# Patient Record
Sex: Male | Born: 1975
Health system: Southern US, Community
[De-identification: ages and names within clinical notes are randomized; demographics above are authoritative.]

## PROBLEM LIST (undated history)

## (undated) DIAGNOSIS — Z8042 Family history of malignant neoplasm of prostate: Secondary | ICD-10-CM

## (undated) DIAGNOSIS — I10 Essential (primary) hypertension: Secondary | ICD-10-CM

## (undated) DIAGNOSIS — Z800 Family history of malignant neoplasm of digestive organs: Secondary | ICD-10-CM

## (undated) DIAGNOSIS — K635 Polyp of colon: Secondary | ICD-10-CM

## (undated) DIAGNOSIS — J309 Allergic rhinitis, unspecified: Secondary | ICD-10-CM

## (undated) DIAGNOSIS — E669 Obesity, unspecified: Secondary | ICD-10-CM

## (undated) DIAGNOSIS — N529 Male erectile dysfunction, unspecified: Secondary | ICD-10-CM

## (undated) DIAGNOSIS — N419 Inflammatory disease of prostate, unspecified: Secondary | ICD-10-CM

## (undated) HISTORY — DX: Essential (primary) hypertension: I10

## (undated) HISTORY — DX: Male erectile dysfunction, unspecified: N52.9

## (undated) HISTORY — DX: Allergic rhinitis, unspecified: J30.9

## (undated) HISTORY — DX: Inflammatory disease of prostate, unspecified: N41.9

## (undated) HISTORY — PX: WISDOM TOOTH EXTRACTION: SHX21

## (undated) HISTORY — DX: Family history of malignant neoplasm of digestive organs: Z80.0

## (undated) HISTORY — PX: APPENDECTOMY: SHX54

## (undated) HISTORY — DX: Obesity, unspecified: E66.9

## (undated) HISTORY — DX: Polyp of colon: K63.5

## (undated) HISTORY — DX: Family history of malignant neoplasm of prostate: Z80.42

---

## 2003-04-23 ENCOUNTER — Emergency Department (HOSPITAL_COMMUNITY): Admission: EM | Admit: 2003-04-23 | Discharge: 2003-04-23 | Payer: Self-pay | Admitting: Emergency Medicine

## 2003-08-14 ENCOUNTER — Emergency Department (HOSPITAL_COMMUNITY): Admission: EM | Admit: 2003-08-14 | Discharge: 2003-08-14 | Payer: Self-pay | Admitting: Emergency Medicine

## 2005-11-17 ENCOUNTER — Emergency Department (HOSPITAL_COMMUNITY): Admission: EM | Admit: 2005-11-17 | Discharge: 2005-11-17 | Payer: Self-pay | Admitting: Family Medicine

## 2005-12-31 ENCOUNTER — Emergency Department (HOSPITAL_COMMUNITY): Admission: EM | Admit: 2005-12-31 | Discharge: 2005-12-31 | Payer: Self-pay | Admitting: Family Medicine

## 2006-02-11 ENCOUNTER — Emergency Department (HOSPITAL_COMMUNITY): Admission: EM | Admit: 2006-02-11 | Discharge: 2006-02-11 | Payer: Self-pay | Admitting: Family Medicine

## 2008-01-07 ENCOUNTER — Emergency Department (HOSPITAL_COMMUNITY): Admission: EM | Admit: 2008-01-07 | Discharge: 2008-01-07 | Payer: Self-pay | Admitting: Family Medicine

## 2008-05-09 ENCOUNTER — Emergency Department (HOSPITAL_COMMUNITY): Admission: EM | Admit: 2008-05-09 | Discharge: 2008-05-10 | Payer: Self-pay | Admitting: Emergency Medicine

## 2009-10-27 ENCOUNTER — Emergency Department (HOSPITAL_COMMUNITY): Admission: EM | Admit: 2009-10-27 | Discharge: 2009-10-27 | Payer: Self-pay | Admitting: Emergency Medicine

## 2010-04-03 IMAGING — CR DG CHEST 2V
2 series · 2 of 2 positions shown · non-contrast
Comparison: None

CLINICAL DATA: Hemoptysis

CHEST - 2 VIEW

[w chest pa]
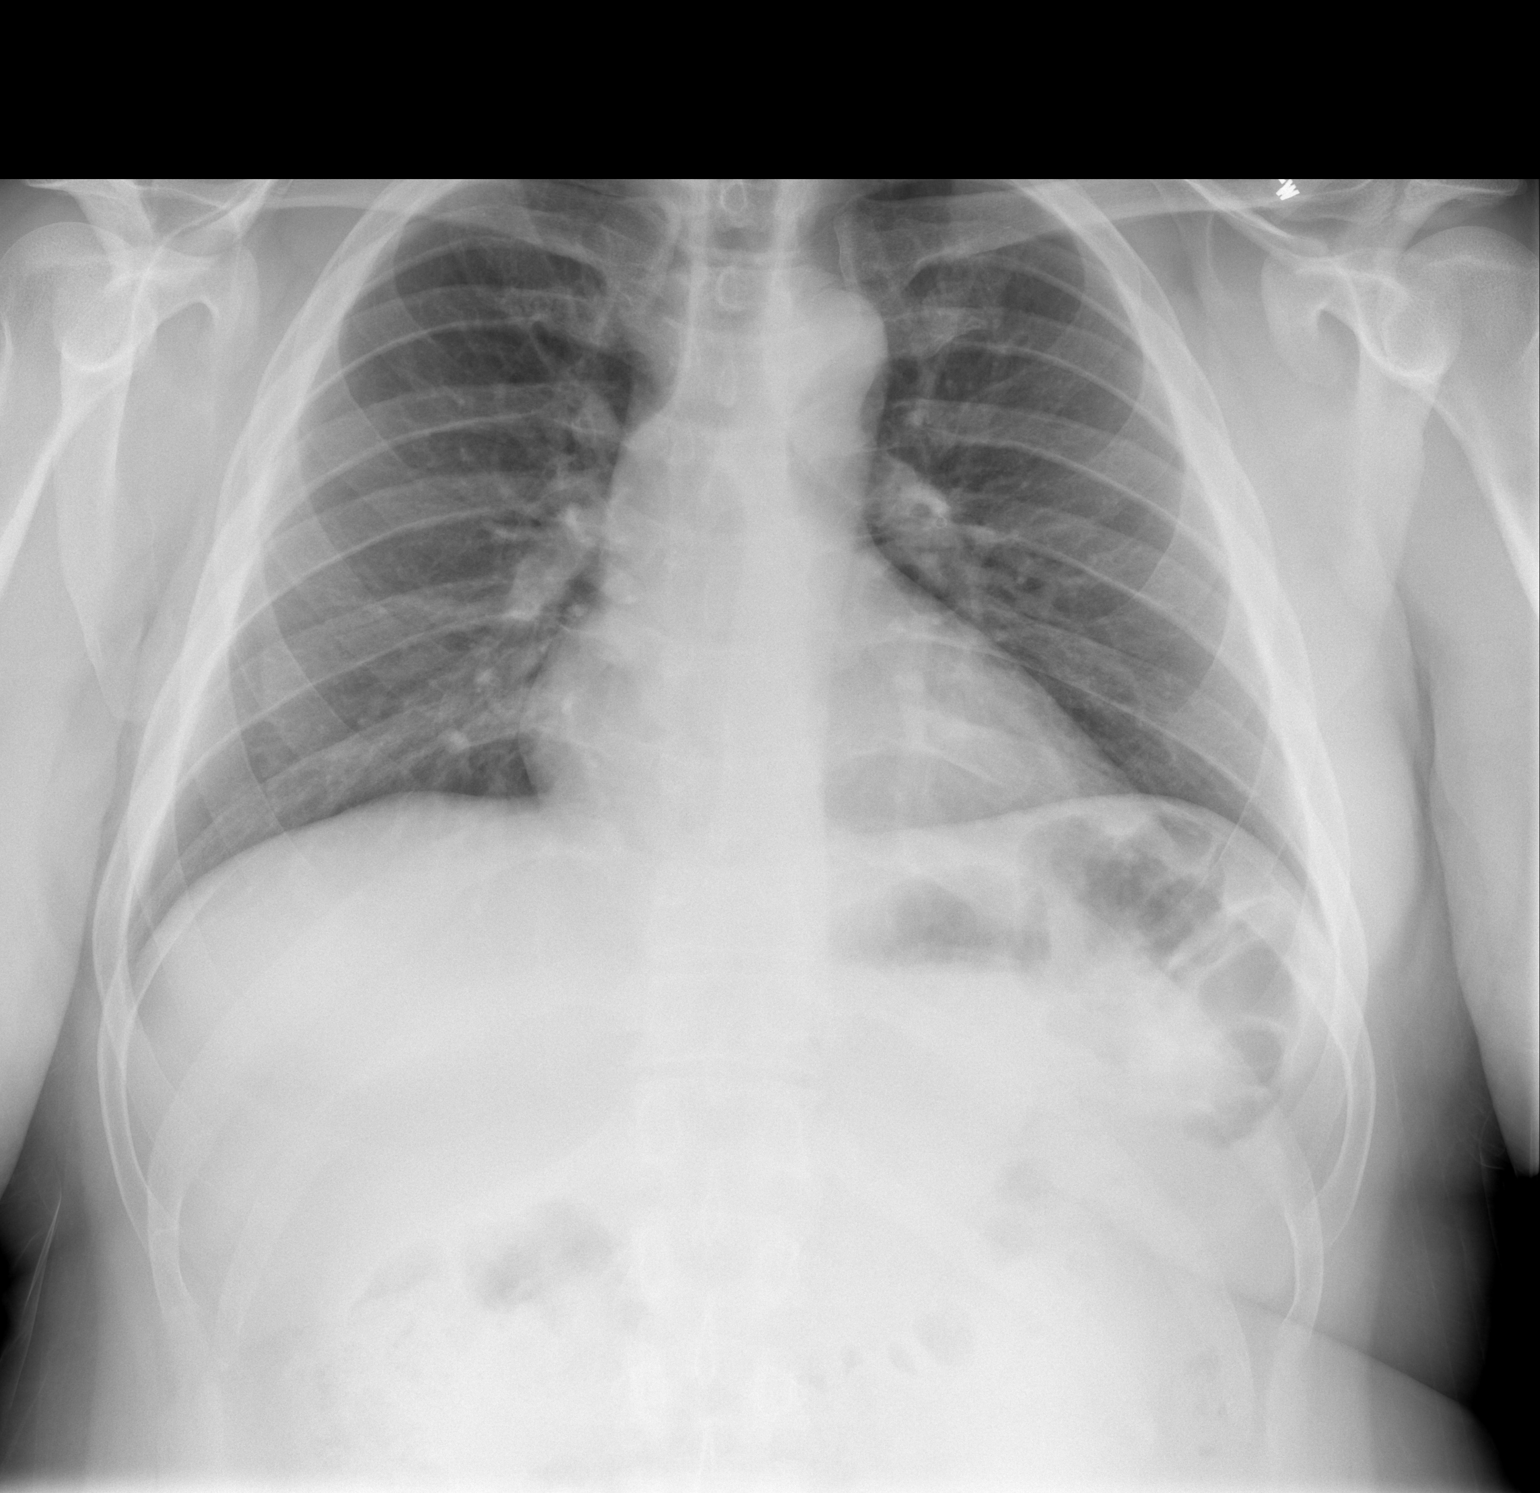

[w chest lat]
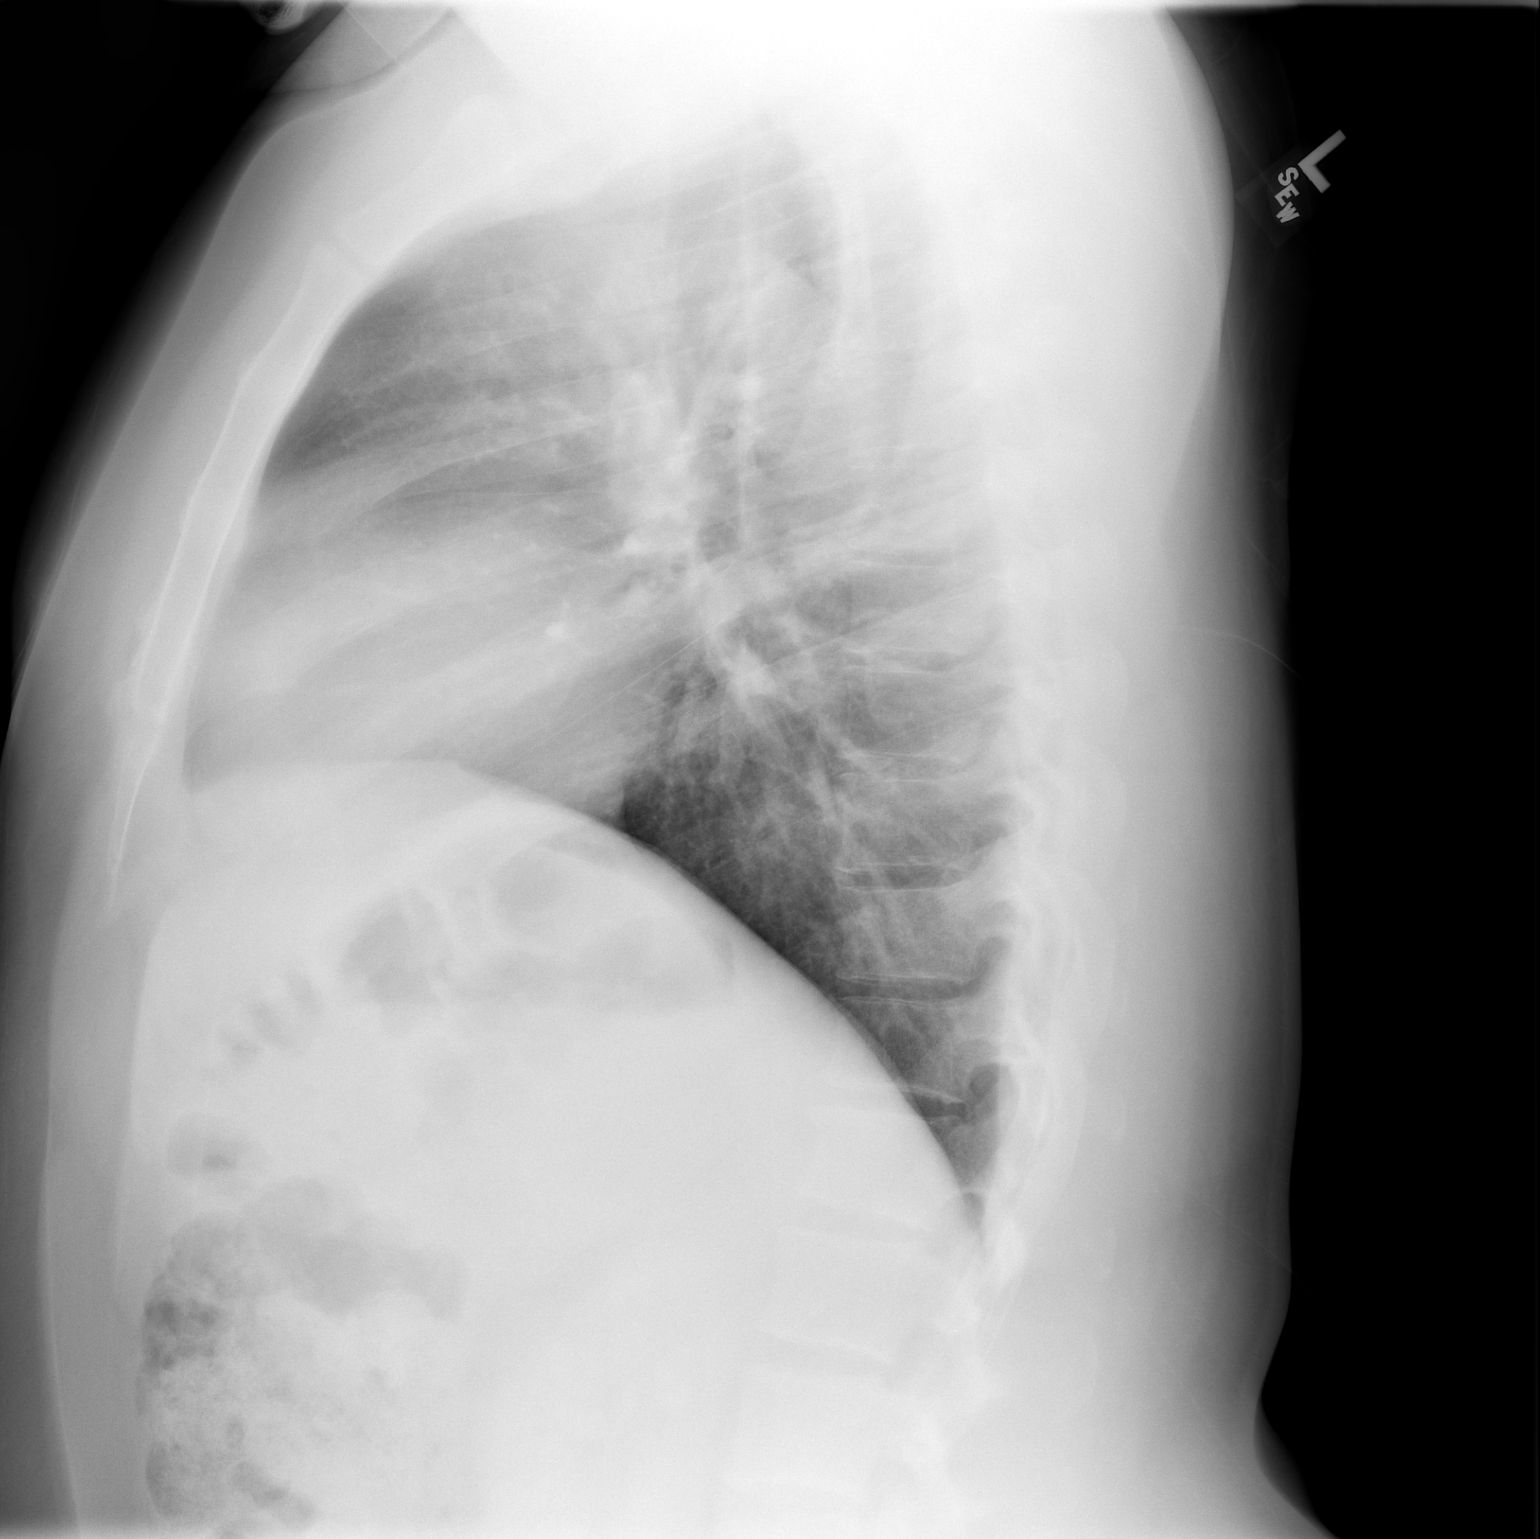

[2 of 2 positions shown; findings below may reference images not displayed]

FINDINGS: The heart size and mediastinal contours are within normal
limits.  Both lungs are clear.  The visualized skeletal structures
are unremarkable.
IMPRESSION: No active cardiopulmonary disease.

## 2011-02-12 LAB — CULTURE, ROUTINE-ABSCESS

## 2013-04-10 ENCOUNTER — Ambulatory Visit (INDEPENDENT_AMBULATORY_CARE_PROVIDER_SITE_OTHER): Payer: BC Managed Care – PPO | Admitting: Medical

## 2013-04-10 ENCOUNTER — Telehealth: Payer: Self-pay | Admitting: Family Medicine

## 2013-04-10 ENCOUNTER — Encounter: Payer: Self-pay | Admitting: Internal Medicine

## 2013-04-10 ENCOUNTER — Encounter: Payer: Self-pay | Admitting: Medical

## 2013-04-10 VITALS — BP 100/78 | HR 60 | Temp 98.2°F | Resp 16 | Ht 67.0 in | Wt 254.0 lb

## 2013-04-10 DIAGNOSIS — D2370 Other benign neoplasm of skin of unspecified lower limb, including hip: Secondary | ICD-10-CM

## 2013-04-10 DIAGNOSIS — E669 Obesity, unspecified: Secondary | ICD-10-CM

## 2013-04-10 DIAGNOSIS — Z23 Encounter for immunization: Secondary | ICD-10-CM

## 2013-04-10 DIAGNOSIS — R229 Localized swelling, mass and lump, unspecified: Secondary | ICD-10-CM

## 2013-04-10 DIAGNOSIS — Z8042 Family history of malignant neoplasm of prostate: Secondary | ICD-10-CM

## 2013-04-10 DIAGNOSIS — Z800 Family history of malignant neoplasm of digestive organs: Secondary | ICD-10-CM

## 2013-04-10 DIAGNOSIS — D367 Benign neoplasm of other specified sites: Secondary | ICD-10-CM

## 2013-04-10 DIAGNOSIS — Z0000 Encounter for general adult medical examination without abnormal findings: Secondary | ICD-10-CM

## 2013-04-10 LAB — CBC WITH DIFFERENTIAL/PLATELET
Basophils Absolute: 0 10*3/uL (ref 0.0–0.1)
Basophils Relative: 0 % (ref 0–1)
Eosinophils Absolute: 0.1 10*3/uL (ref 0.0–0.7)
MCH: 30.6 pg (ref 26.0–34.0)
MCHC: 34.4 g/dL (ref 30.0–36.0)
Neutro Abs: 2.9 10*3/uL (ref 1.7–7.7)
Neutrophils Relative %: 52 % (ref 43–77)
Platelets: 259 10*3/uL (ref 150–400)
RDW: 13.7 % (ref 11.5–15.5)

## 2013-04-10 LAB — POCT URINALYSIS DIPSTICK
Bilirubin, UA: NEGATIVE
Blood, UA: NEGATIVE
Glucose, UA: NEGATIVE
Nitrite, UA: NEGATIVE
Spec Grav, UA: <=1.005
Urobilinogen, UA: NEGATIVE

## 2013-04-10 NOTE — Progress Notes (Signed)
Subjective:   HPI  Steven Mullins is a 37 y.o. male who presents for a complete physical.  New patient today.   Preventative care: Last ophthalmology visit:n/a Last dental visit:yes- Dr. Lowell Guitar Last colonoscopy:n/a Last prostate exam: n/a Last EKG:2004/2005 Last labs:n/a, last physical 5 years ago  Prior vaccinations: TD or Tdap:04/10/13 Influenza:never Pneumococcal:n/a Shingles/Zostavax:n/a  Advanced directive:n/a Health care power of attorney:n/a Living will:n/a  Concerns: Knot of right anterior thigh for 3 years unchanged.  Reviewed their medical, surgical, family, social, medication, and allergy history and updated chart as appropriate.   Past Medical History  Diagnosis Date  . Allergic rhinitis     Past Surgical History  Procedure Laterality Date  . Appendectomy    . Wisdom tooth extraction      Family History  Problem Relation Age of Onset  . Cancer Mother 49    colon  . Hypertension Mother   . Thyroid disease Mother   . Hypertension Father   . Cancer Father 61    prostate  . Thyroid disease Brother   . Cancer Maternal Uncle     prostate  . Heart disease Maternal Uncle   . Cancer Maternal Grandfather     prostate  . Diabetes Neg Hx   . Stroke Neg Hx     History   Social History  . Marital Status: Married    Spouse Name: N/A    Number of Children: N/A  . Years of Education: N/A   Occupational History  . Not on file.   Social History Main Topics  . Smoking status: Never Smoker   . Smokeless tobacco: Not on file  . Alcohol Use: No  . Drug Use: No  . Sexually Active: Not on file   Other Topics Concern  . Not on file   Social History Narrative   Married, 2 children, limited exercise, works in Clinical biochemist at [employer]    No current outpatient prescriptions on file prior to visit.   No current facility-administered medications on file prior to visit.    No Known Allergies   Review of Systems Constitutional: -fever,  -chills, -sweats, -unexpected weight change, -decreased appetite, -fatigue Allergy: -sneezing, -itching, -congestion Dermatology: -changing moles, --rash, +lumps ENT: -runny nose, -ear pain, -sore throat, -hoarseness, -sinus pain, -teeth pain, - ringing in ears, -hearing loss, -nosebleeds Cardiology: -chest pain, -palpitations, -swelling, -difficulty breathing when lying flat, -waking up short of breath Respiratory: -cough, -shortness of breath, -difficulty breathing with exercise or exertion, -wheezing, -coughing up blood Gastroenterology: -abdominal pain, -nausea, -vomiting, -diarrhea, -constipation, -blood in stool, -changes in bowel movement, -difficulty swallowing or eating Hematology: -bleeding, -bruising  Musculoskeletal: -joint aches, -muscle aches, -joint swelling, -back pain, -neck pain, -cramping, -changes in gait Ophthalmology: denies vision changes, eye redness, itching, discharge Urology: -burning with urination, -difficulty urinating, -blood in urine, -urinary frequency, -urgency, -incontinence Neurology: -headache, -weakness, -tingling, -numbness, -memory loss, -falls, -dizziness Psychology: -depressed mood, -agitation, -sleep problems     Objective:   Physical Exam  Nurse notes and vitals reviewed   General appearance: alert, no distress, WD/WN, obese AA male Skin: right earlobe with 2 thickened nodular areas posteriorly and superior edge of pinna with thickened skin he attributes to callous from chronic picking of the ears, similar superior pinna thickening left ear too.  Small 3-1mm diameter dermoid cyst of right upper thigh anteriorly, othewise skin unremarkable HEENT: normocephalic, conjunctiva/corneas normal, sclerae anicteric, PERRLA, EOMi, nares patent, no discharge or erythema, pharynx normal Oral cavity: MMM, tongue normal, teeth in  good repair Neck: supple, no lymphadenopathy, no thyromegaly, no masses, normal ROM Chest: non tender, normal shape and  expansion Heart: RRR, normal S1, S2, no murmurs Lungs: CTA bilaterally, no wheezes, rhonchi, or rales Abdomen: +bs, umbilical, RUQ and RLQ surgical scars, soft, non tender, non distended, no masses, no hepatomegaly, no splenomegaly, no bruits Back: non tender, normal ROM, no scoliosis Musculoskeletal: upper extremities non tender, no obvious deformity, normal ROM throughout, lower extremities non tender, no obvious deformity, normal ROM throughout Extremities: no edema, no cyanosis, no clubbing Pulses: 2+ symmetric, upper and lower extremities, normal cap refill Neurological: alert, oriented x 3, CN2-12 intact, strength normal upper extremities and lower extremities, sensation normal throughout, DTRs 2+ throughout, no cerebellar signs, gait normal Psychiatric: normal affect, behavior normal, pleasant  GU: normal male external genitalia, nontender, few small spermatoceles palpated, but otherwise no masses, no hernia, no lymphadenopathy Rectal: anus normal tone, prostate WNL, no nodules, occult negative stool   Assessment and Plan :      Encounter Diagnoses  Name Primary?  . Routine general medical examination at a health care facility Yes  . Need for Tdap vaccination   . Family history of colon cancer   . Family history of prostate cancer   . Obesity, unspecified   . Dermoid cyst of leg, right   . Skin nodule     Physical exam - discussed healthy lifestyle, diet, exercise, preventative care, vaccinations, and addressed their concerns.    tdap vaccine, VIS and counseling given  Referral for screening colonoscopy given mother's early history.  Prostate exam today.  Discussed testicular cancer screening, regular self exams.  Obesity - advised lifestyle changes, weight loss  Cyst of leg - small, reassured  Skin nodules - bilat ears, thickened, he attributes to callous from habitual twisting rubbing of his ears.   Will keep and eye on this due to sun exposure, potential for skin  cancer higher in this sun exposed area.    Follow-up pending labs

## 2013-04-10 NOTE — Telephone Encounter (Signed)
Patient is aware of his appointment for a colonoscopy on April 28, 2013 @ 1000 am with Dr. Leone Payor and nurse visit on April 16, 2013 @ 430 pm. CLS [phone]

## 2013-04-11 LAB — LIPID PANEL
Cholesterol: 181 mg/dL (ref 0–200)
Triglycerides: 65 mg/dL (ref <150)
VLDL: 13 mg/dL (ref 0–40)

## 2013-04-11 LAB — COMPREHENSIVE METABOLIC PANEL
AST: 21 U/L (ref 0–37)
Albumin: 4.3 g/dL (ref 3.5–5.2)
Alkaline Phosphatase: 88 U/L (ref 39–117)
BUN: 11 mg/dL (ref 6–23)
Calcium: 9.6 mg/dL (ref 8.4–10.5)
Chloride: 102 mEq/L (ref 96–112)
Potassium: 5 mEq/L (ref 3.5–5.3)
Sodium: 137 mEq/L (ref 135–145)
Total Protein: 6.9 g/dL (ref 6.0–8.3)

## 2013-04-28 ENCOUNTER — Encounter: Payer: Self-pay | Admitting: Internal Medicine

## 2013-06-18 ENCOUNTER — Telehealth: Payer: Self-pay

## 2013-06-18 NOTE — Telephone Encounter (Signed)
Called to reschedule previsit.  Left message.

## 2013-07-02 ENCOUNTER — Encounter: Payer: Self-pay | Admitting: Internal Medicine

## 2016-03-12 DIAGNOSIS — N419 Inflammatory disease of prostate, unspecified: Secondary | ICD-10-CM

## 2016-03-12 HISTORY — DX: Inflammatory disease of prostate, unspecified: N41.9

## 2016-03-22 ENCOUNTER — Ambulatory Visit (INDEPENDENT_AMBULATORY_CARE_PROVIDER_SITE_OTHER): Payer: 59 | Admitting: Medical

## 2016-03-22 ENCOUNTER — Encounter: Payer: Self-pay | Admitting: Medical

## 2016-03-22 VITALS — BP 130/90 | HR 77 | Wt 270.0 lb

## 2016-03-22 DIAGNOSIS — R300 Dysuria: Secondary | ICD-10-CM | POA: Diagnosis not present

## 2016-03-22 DIAGNOSIS — R39198 Other difficulties with micturition: Secondary | ICD-10-CM | POA: Diagnosis not present

## 2016-03-22 DIAGNOSIS — I861 Scrotal varices: Secondary | ICD-10-CM

## 2016-03-22 DIAGNOSIS — Z8042 Family history of malignant neoplasm of prostate: Secondary | ICD-10-CM

## 2016-03-22 DIAGNOSIS — R319 Hematuria, unspecified: Secondary | ICD-10-CM

## 2016-03-22 DIAGNOSIS — Z800 Family history of malignant neoplasm of digestive organs: Secondary | ICD-10-CM

## 2016-03-22 LAB — POCT URINALYSIS DIPSTICK
Bilirubin, UA: NEGATIVE
Blood, UA: NEGATIVE
Glucose, UA: NEGATIVE
KETONES UA: NEGATIVE
LEUKOCYTES UA: NEGATIVE
Nitrite, UA: NEGATIVE
PH UA: 6
PROTEIN UA: NEGATIVE
SPEC GRAV UA: 1.025
UROBILINOGEN UA: NEGATIVE

## 2016-03-22 NOTE — Progress Notes (Signed)
Subjective: Chief Complaint  Patient presents with  . Urinary Tract Infection    said he felt like he had a blockage when he tried to urinate, then it would burn. said that he feels like it is less now. said he did have some blood in the urine once last week.   Here for urinary c/o.  Last visit here 2014.  Thinks he has a UTI.  He notes recently felt like something was blocking the stream of urine . Had some stinging.   This continued for a few days.  Last week saw a tinge of blood in the urine.   Is able to void, but with these symptoms.   This started a month ago.   But in the last few days seems improved.  No fever, no NVD, urine doesn't look funnny or cloudy, doesn't have an odor.  No testicle pain, no testicle swelling, no penile discharge, no rectal pain.  In general gets up at least once nightly to urinate.   Sometimes has urinary hesitancy, but no dribbling.   Urine stream seems weaker than in the past, mainly in the morning,but fine stream in the day.   Married.  No concern for STD.  No hx/o UTI or prostate infection.   Using nothing for symptoms.  Never had the colonoscopy  No other aggravating or relieving factors. No other complaint.   Past Medical History  Diagnosis Date  . Allergic rhinitis   . Obesity   . Family history of prostate cancer    Family History  Problem Relation Age of Onset  . Cancer Mother 53    colon  . Hypertension Mother   . Thyroid disease Mother   . Hypertension Father   . Cancer Father 61    prostate  . Thyroid disease Brother   . Cancer Maternal Uncle     prostate  . Heart disease Maternal Uncle   . Cancer Maternal Grandfather     prostate  . Diabetes Neg Hx   . Stroke Neg Hx      ROS as in subjective   Objective: BP 130/90 mmHg  Pulse 77  Wt 270 lb (122.471 kg)  Gen: wd, wn, nad, obese AA male Abdomen: +bs, soft, non tender, no mass, no organomegaly Back: non tender GU: circumcised male, penis nontender, no rash, right scrotum with  bag of worms texture without mass consistent with varicocele, but left testicle and scrotum normal, non tender in general, no hernia, no lymphadenopathy DRE: anus normal tone, prostate seems mildly enlarged, but not boggy, no nodule, occult negative stool    Assessment: Encounter Diagnoses  Name Primary?  . Dysuria Yes  . Varicocele   . Decreased urine stream   . Hematuria   . Family history of prostate cancer   . Family history of colon cancer      Plan: He is currently asymptomatic and UA is normal.  discussed symptoms, concerns, differential which likely includes BPH vs prostatitis vs UTI.   Labs today, advised to return if he gets symptoms again.   Advised he call insurance company about having early colonoscopy screen given family history.   F/u soon for physical.

## 2016-03-22 NOTE — Patient Instructions (Signed)
Encounter Diagnoses  Name Primary?  . Dysuria Yes  . Varicocele   . Decreased urine stream   . Hematuria   . Family history of prostate cancer   . Family history of colon cancer     Recommendations:  Call your insurer about getting a screening colonoscopy.  Due to your family history of mother with colon cancer in her 28's, you are recommended to have a screening colonoscopy now, not the normal age of 40 yo.    If insurer declines this, then we can refer to gastroenterology and see if they can get it covered/approved.

## 2016-03-23 LAB — URINE CULTURE
Colony Count: NO GROWTH
ORGANISM ID, BACTERIA: NO GROWTH

## 2016-03-23 LAB — PSA: PSA: 0.79 ng/mL (ref <=4.00)

## 2016-03-27 ENCOUNTER — Other Ambulatory Visit: Payer: Self-pay | Admitting: Medical

## 2016-03-27 MED ORDER — CIPROFLOXACIN HCL 500 MG PO TABS: 500.0000 mg | ORAL_TABLET | Freq: Two times a day (BID) | ORAL | Status: DC

## 2016-04-12 ENCOUNTER — Encounter: Payer: Self-pay | Admitting: Medical

## 2016-04-12 ENCOUNTER — Ambulatory Visit (INDEPENDENT_AMBULATORY_CARE_PROVIDER_SITE_OTHER): Payer: 59 | Admitting: Medical

## 2016-04-12 VITALS — BP 140/90 | HR 76 | Ht 66.5 in | Wt 266.0 lb

## 2016-04-12 DIAGNOSIS — J309 Allergic rhinitis, unspecified: Secondary | ICD-10-CM

## 2016-04-12 DIAGNOSIS — E669 Obesity, unspecified: Secondary | ICD-10-CM

## 2016-04-12 DIAGNOSIS — Z800 Family history of malignant neoplasm of digestive organs: Secondary | ICD-10-CM | POA: Diagnosis not present

## 2016-04-12 DIAGNOSIS — K635 Polyp of colon: Secondary | ICD-10-CM

## 2016-04-12 DIAGNOSIS — Z8042 Family history of malignant neoplasm of prostate: Secondary | ICD-10-CM | POA: Diagnosis not present

## 2016-04-12 DIAGNOSIS — Z0000 Encounter for general adult medical examination without abnormal findings: Secondary | ICD-10-CM | POA: Diagnosis not present

## 2016-04-12 DIAGNOSIS — R030 Elevated blood-pressure reading, without diagnosis of hypertension: Secondary | ICD-10-CM

## 2016-04-12 HISTORY — DX: Polyp of colon: K63.5

## 2016-04-12 HISTORY — PX: COLONOSCOPY: SHX174

## 2016-04-12 LAB — COMPREHENSIVE METABOLIC PANEL
ALK PHOS: 82 U/L (ref 40–115)
ALT: 37 U/L (ref 9–46)
AST: 115 U/L — ABNORMAL HIGH (ref 10–40)
Albumin: 4.1 g/dL (ref 3.6–5.1)
BUN: 12 mg/dL (ref 7–25)
CO2: 26 mmol/L (ref 20–31)
CREATININE: 0.81 mg/dL (ref 0.60–1.35)
Calcium: 9 mg/dL (ref 8.6–10.3)
Chloride: 101 mmol/L (ref 98–110)
Glucose, Bld: 82 mg/dL (ref 65–99)
Potassium: 4.5 mmol/L (ref 3.5–5.3)
SODIUM: 135 mmol/L (ref 135–146)
TOTAL PROTEIN: 6.8 g/dL (ref 6.1–8.1)
Total Bilirubin: 0.8 mg/dL (ref 0.2–1.2)

## 2016-04-12 LAB — CBC WITH DIFFERENTIAL/PLATELET
BASOS PCT: 0 %
Basophils Absolute: 0 cells/uL (ref 0–200)
EOS ABS: 180 {cells}/uL (ref 15–500)
Eosinophils Relative: 3 %
HEMATOCRIT: 43.6 % (ref 38.5–50.0)
Hemoglobin: 14.6 g/dL (ref 13.2–17.1)
LYMPHS ABS: 2040 {cells}/uL (ref 850–3900)
Lymphocytes Relative: 34 %
MCH: 29.7 pg (ref 27.0–33.0)
MCHC: 33.5 g/dL (ref 32.0–36.0)
MCV: 88.8 fL (ref 80.0–100.0)
MONO ABS: 360 {cells}/uL (ref 200–950)
MPV: 10.7 fL (ref 7.5–12.5)
Monocytes Relative: 6 %
NEUTROS ABS: 3420 {cells}/uL (ref 1500–7800)
Neutrophils Relative %: 57 %
PLATELETS: 265 10*3/uL (ref 140–400)
RBC: 4.91 MIL/uL (ref 4.20–5.80)
RDW: 14.1 % (ref 11.0–15.0)
WBC: 6 10*3/uL (ref 4.0–10.5)

## 2016-04-12 LAB — POCT URINALYSIS DIPSTICK
Bilirubin, UA: NEGATIVE
Blood, UA: NEGATIVE
Glucose, UA: NEGATIVE
KETONES UA: NEGATIVE
LEUKOCYTES UA: NEGATIVE
NITRITE UA: NEGATIVE
PH UA: 6
PROTEIN UA: NEGATIVE
Spec Grav, UA: >=1.03
Urobilinogen, UA: NEGATIVE

## 2016-04-12 LAB — HEMOGLOBIN A1C
Hgb A1c MFr Bld: 5.2 % (ref <5.7)
MEAN PLASMA GLUCOSE: 103 mg/dL

## 2016-04-12 LAB — LIPID PANEL
CHOLESTEROL: 170 mg/dL (ref 125–200)
HDL: 55 mg/dL (ref >=40)
LDL Cholesterol: 105 mg/dL (ref <130)
Total CHOL/HDL Ratio: 3.1 Ratio (ref <=5.0)
Triglycerides: 52 mg/dL (ref <150)
VLDL: 10 mg/dL (ref <30)

## 2016-04-12 LAB — TSH: TSH: 0.96 mIU/L (ref 0.40–4.50)

## 2016-04-12 NOTE — Progress Notes (Signed)
Subjective:   HPI  Steven Mullins is a 40 y.o. male who presents for a complete physical.     Concerns: None, has colonoscopy screen set for next week  Reviewed their medical, surgical, family, social, medication, and allergy history and updated chart as appropriate.   Past Medical History  Diagnosis Date  . Allergic rhinitis   . Obesity   . Family history of prostate cancer   . Prostatitis 03/2016  . Family history of colon cancer in mother     Past Surgical History  Procedure Laterality Date  . Appendectomy    . Wisdom tooth extraction    . Colonoscopy      pending 1st screen 04/2016    Family History  Problem Relation Age of Onset  . Cancer Mother 74    colon  . Hypertension Mother   . Thyroid disease Mother   . Hypertension Father   . Cancer Father 31    prostate  . Thyroid disease Brother   . Cancer Maternal Uncle     prostate  . Heart disease Maternal Uncle   . Cancer Maternal Grandfather     prostate  . Diabetes Neg Hx   . Stroke Neg Hx     Social History   Social History  . Marital Status: Married    Spouse Name: N/A  . Number of Children: N/A  . Years of Education: N/A   Occupational History  . Not on file.   Social History Main Topics  . Smoking status: Never Smoker   . Smokeless tobacco: Not on file  . Alcohol Use: Yes     Comment: occasional  . Drug Use: No  . Sexual Activity: Not on file   Other Topics Concern  . Not on file   Social History Narrative   Married, 2 children,goes to gym, lots of walking, some [employer], works for [employer]    No current outpatient prescriptions on file prior to visit.   No current facility-administered medications on file prior to visit.    No Known Allergies   Review of Systems Constitutional: -fever, -chills, -sweats, -unexpected weight change, -decreased appetite, -fatigue Allergy: -sneezing, -itching, -congestion Dermatology: -changing moles, --rash, -lumps ENT: -runny  nose, -ear pain, -sore throat, -hoarseness, -sinus pain, -teeth pain, - ringing in ears, -hearing loss, -nosebleeds Cardiology: -chest pain, -palpitations, -swelling, -difficulty breathing when lying flat, -waking up short of breath Respiratory: -cough, -shortness of breath, -difficulty breathing with exercise or exertion, -wheezing, -coughing up blood Gastroenterology: -abdominal pain, -nausea, -vomiting, -diarrhea, -constipation, -blood in stool, -changes in bowel movement, -difficulty swallowing or eating Hematology: -bleeding, -bruising  Musculoskeletal: -joint aches, -muscle aches, -joint swelling, -back pain, -neck pain, -cramping, -changes in gait Ophthalmology: denies vision changes, eye redness, itching, discharge Urology: -burning with urination, -difficulty urinating, -blood in urine, -urinary frequency, -urgency, -incontinence Neurology: -headache, -weakness, -tingling, -numbness, -memory loss, -falls, -dizziness Psychology: -depressed mood, -agitation, -sleep problems     Objective:   Physical Exam  BP 140/90 mmHg  Pulse 76  Ht 5' 6.5" (1.689 m)  Wt 266 lb (120.657 kg)  BMI 42.30 kg/m2  General appearance: alert, no distress, WD/WN, obese AA male Skin: right and left upper earlobe with 1 thickened nodular areas posteriorly and superior edge of pinna with thickened skin he attributes to callous from chronic picking of the ears, similar superior pinna thickening left ear too.  Small 3-58mm diameter dermoid cyst of right upper thigh anteriorly, otherwise skin unremarkable HEENT: normocephalic, conjunctiva/corneas normal,  sclerae anicteric, PERRLA, EOMi, nares patent, no discharge or erythema, pharynx normal Oral cavity: MMM, tongue normal, teeth in good repair Neck: supple, no lymphadenopathy, no thyromegaly, no masses, normal ROM, no bruits Chest: non tender, normal shape and expansion Heart: RRR, normal S1, S2, no murmurs Lungs: CTA bilaterally, no wheezes, rhonchi, or  rales Abdomen: +bs, umbilical, RUQ and RLQ surgical scars, soft, non tender, non distended, no masses, no hepatomegaly, no splenomegaly, no bruits Back: non tender, normal ROM, no scoliosis Musculoskeletal: upper extremities non tender, no obvious deformity, normal ROM throughout, lower extremities non tender, no obvious deformity, normal ROM throughout Extremities: no edema, no cyanosis, no clubbing Pulses: 2+ symmetric, upper and lower extremities, normal cap refill Neurological: alert, oriented x 3, CN2-12 intact, strength normal upper extremities and lower extremities, sensation normal throughout, DTRs 2+ throughout, no cerebellar signs, gait normal Psychiatric: normal affect, behavior normal, pleasant  GU: normal male external genitalia, circumcised, nontender, few small spermatoceles palpated, but otherwise no masses, no hernia, no lymphadenopathy Rectal: deferred, examined last visit recently   Assessment and Plan :     Encounter Diagnoses  Name Primary?  . Encounter for health maintenance examination in adult Yes  . Obesity   . Elevated blood pressure reading without diagnosis of hypertension   . Allergic rhinitis, unspecified allergic rhinitis type   . Family history of colon cancer in mother   . Family history of prostate cancer in father     Physical exam - discussed healthy lifestyle, diet, exercise, preventative care, vaccinations, and addressed their concerns.   Work on [employer] loss through health diet, exercise F/u for colonoscopy as planned Routine labs today Monitor BPs at home, and get me readings in 21mo See your eye doctor yearly for routine vision care. See your dentist yearly for routine dental care including hygiene visits twice yearly. Follow-up pending labs  Myers was seen today for annual exam.  Diagnoses and all orders for this visit:  Encounter for health maintenance examination in adult -     Comprehensive metabolic panel -     Lipid panel -     CBC  with Differential/Platelet -     TSH -     Hemoglobin A1c  Obesity -     TSH -     Hemoglobin A1c  Elevated blood pressure reading without diagnosis of hypertension -     Comprehensive metabolic panel  Allergic rhinitis, unspecified allergic rhinitis type  Family history of colon cancer in mother  Family history of prostate cancer in father

## 2016-04-12 NOTE — Addendum Note (Signed)
Addended by: Billie Lade on: 04/12/2016 09:32 AM   Modules accepted: Orders, SmartSet

## 2016-04-16 ENCOUNTER — Other Ambulatory Visit: Payer: Self-pay | Admitting: Medical

## 2016-04-16 DIAGNOSIS — R748 Abnormal levels of other serum enzymes: Secondary | ICD-10-CM

## 2016-04-17 ENCOUNTER — Encounter: Payer: Self-pay | Admitting: Medical

## 2016-04-17 LAB — HM COLONOSCOPY

## 2016-04-20 ENCOUNTER — Encounter: Payer: Self-pay | Admitting: Medical

## 2016-04-24 ENCOUNTER — Encounter: Payer: Self-pay | Admitting: Medical

## 2016-06-21 ENCOUNTER — Other Ambulatory Visit: Payer: 59

## 2016-06-21 DIAGNOSIS — R748 Abnormal levels of other serum enzymes: Secondary | ICD-10-CM

## 2016-06-21 LAB — HEPATIC FUNCTION PANEL
ALBUMIN: 3.8 g/dL (ref 3.6–5.1)
ALK PHOS: 81 U/L (ref 40–115)
ALT: 17 U/L (ref 9–46)
AST: 16 U/L (ref 10–40)
BILIRUBIN TOTAL: 1 mg/dL (ref 0.2–1.2)
Bilirubin, Direct: 0.2 mg/dL (ref <=0.2)
Indirect Bilirubin: 0.8 mg/dL (ref 0.2–1.2)
TOTAL PROTEIN: 6.3 g/dL (ref 6.1–8.1)

## 2017-04-03 ENCOUNTER — Ambulatory Visit (HOSPITAL_COMMUNITY)
Admission: EM | Admit: 2017-04-03 | Discharge: 2017-04-03 | Disposition: A | Discharge status: Home / Self Care | Payer: 59 | Attending: Internal Medicine | Admitting: Internal Medicine

## 2017-04-03 ENCOUNTER — Encounter (HOSPITAL_COMMUNITY): Payer: Self-pay | Admitting: Emergency Medicine

## 2017-04-03 DIAGNOSIS — E669 Obesity, unspecified: Secondary | ICD-10-CM | POA: Insufficient documentation

## 2017-04-03 DIAGNOSIS — Z8042 Family history of malignant neoplasm of prostate: Secondary | ICD-10-CM | POA: Diagnosis not present

## 2017-04-03 DIAGNOSIS — R030 Elevated blood-pressure reading, without diagnosis of hypertension: Secondary | ICD-10-CM | POA: Diagnosis not present

## 2017-04-03 DIAGNOSIS — Z8249 Family history of ischemic heart disease and other diseases of the circulatory system: Secondary | ICD-10-CM | POA: Diagnosis not present

## 2017-04-03 DIAGNOSIS — Z800 Family history of malignant neoplasm of digestive organs: Secondary | ICD-10-CM | POA: Insufficient documentation

## 2017-04-03 DIAGNOSIS — Z8349 Family history of other endocrine, nutritional and metabolic diseases: Secondary | ICD-10-CM | POA: Diagnosis not present

## 2017-04-03 DIAGNOSIS — R002 Palpitations: Secondary | ICD-10-CM

## 2017-04-03 LAB — CBC WITH DIFFERENTIAL/PLATELET
BASOS PCT: 0 %
Basophils Absolute: 0 10*3/uL (ref 0.0–0.1)
EOS ABS: 0.1 10*3/uL (ref 0.0–0.7)
Eosinophils Relative: 1 %
HEMATOCRIT: 45.4 % (ref 39.0–52.0)
HEMOGLOBIN: 15.3 g/dL (ref 13.0–17.0)
LYMPHS ABS: 2.7 10*3/uL (ref 0.7–4.0)
Lymphocytes Relative: 36 %
MCH: 29.9 pg (ref 26.0–34.0)
MCHC: 33.7 g/dL (ref 30.0–36.0)
MCV: 88.8 fL (ref 78.0–100.0)
Monocytes Absolute: 0.5 10*3/uL (ref 0.1–1.0)
Monocytes Relative: 7 %
NEUTROS ABS: 4.3 10*3/uL (ref 1.7–7.7)
NEUTROS PCT: 56 %
Platelets: 263 10*3/uL (ref 150–400)
RBC: 5.11 MIL/uL (ref 4.22–5.81)
RDW: 13.2 % (ref 11.5–15.5)
WBC: 7.6 10*3/uL (ref 4.0–10.5)

## 2017-04-03 LAB — T4, FREE: Free T4: 0.99 ng/dL (ref 0.61–1.12)

## 2017-04-03 LAB — COMPREHENSIVE METABOLIC PANEL
ALBUMIN: 4.3 g/dL (ref 3.5–5.0)
ALK PHOS: 95 U/L (ref 38–126)
ALT: 30 U/L (ref 17–63)
AST: 30 U/L (ref 15–41)
Anion gap: 8 (ref 5–15)
BUN: 8 mg/dL (ref 6–20)
CALCIUM: 9.1 mg/dL (ref 8.9–10.3)
CO2: 26 mmol/L (ref 22–32)
CREATININE: 0.88 mg/dL (ref 0.61–1.24)
Chloride: 102 mmol/L (ref 101–111)
GFR calc Af Amer: >60 mL/min (ref >60)
GFR calc non Af Amer: >60 mL/min (ref >60)
GLUCOSE: 87 mg/dL (ref 65–99)
Potassium: 3.6 mmol/L (ref 3.5–5.1)
SODIUM: 136 mmol/L (ref 135–145)
Total Bilirubin: 1 mg/dL (ref 0.3–1.2)
Total Protein: 7.6 g/dL (ref 6.5–8.1)

## 2017-04-03 LAB — MAGNESIUM: Magnesium: 2 mg/dL (ref 1.7–2.4)

## 2017-04-03 LAB — TSH: TSH: 1.983 u[IU]/mL (ref 0.350–4.500)

## 2017-04-03 MED ORDER — PROPRANOLOL HCL 10 MG PO TABS: 10.0000 mg | ORAL_TABLET | Freq: Four times a day (QID) | ORAL | 0 refills | Status: DC | PRN

## 2017-04-03 NOTE — ED Triage Notes (Addendum)
Pt has been feeling intermittent bouts of irregular heartbeat over the last several months.  He denies any associated symptoms.  No SOB, headache, chest pain or dizziness.  Pt walks 4 miles several days a week for exercise and denies any issues with this.  Pt has a son in the hospital and is having some stress related to this.

## 2017-04-03 NOTE — ED Provider Notes (Signed)
[facility]    CSN: [id number] Arrival date & time: 04/03/17  1831     History   Chief Complaint Chief Complaint  Patient presents with  . Irregular Heart Beat  . Hypertension    HPI Steven Mullins is a 41 y.o. male. Presents with intermittent palpitations for the last couple months.  Lasts for about 10 minutes, maybe 2-3 times most days.  No chest pain, not short of breath.  No unusual leg pain/swelling.  Not interfering with ability to do usual activities; walked 4 miles today in a little more than an hour, no trouble.  Son is sick, having trips to doctor, some stress.  Came in today to have checked.   Personal hx borderline HTN, not on treatment yet.  No cholesterol, no diabetes, non smoker.  No family hx early MI.  No family hx blood clots.  Brother has a thyroid condition.      HPI  Past Medical History:  Diagnosis Date  . Allergic rhinitis   . Family history of colon cancer in mother   . Family history of prostate cancer   . Obesity   . Prostatitis 03/2016    Patient Active Problem List   Diagnosis Date Noted  . Encounter for health maintenance examination in adult 04/12/2016  . Obesity 04/12/2016  . Elevated blood pressure reading without diagnosis of hypertension 04/12/2016  . Rhinitis, allergic 04/12/2016  . Family history of colon cancer in mother 04/12/2016  . Family history of prostate cancer in father 04/12/2016    Past Surgical History:  Procedure Laterality Date  . APPENDECTOMY    . COLONOSCOPY     pending 1st screen 04/2016  . WISDOM TOOTH EXTRACTION         Home Medications   Takes no meds regularly  Family History Family History  Problem Relation Age of Onset  . Cancer Mother 11       colon  . Hypertension Mother   . Thyroid disease Mother   . Hypertension Father   . Cancer Father 13       prostate  . Thyroid disease Brother   . Cancer Maternal Uncle        prostate  . Heart disease Maternal Uncle   . Cancer Maternal  Grandfather        prostate  . Diabetes Neg Hx   . Stroke Neg Hx     Social History Social History  Substance Use Topics  . Smoking status: Never Smoker  . Smokeless tobacco: Never Used  . Alcohol use Yes     Comment: occasional     Allergies   Patient has no known allergies.   Review of Systems Review of Systems  All other systems reviewed and are negative.    Physical Exam Triage Vital Signs ED Triage Vitals [04/03/17 1841]  Enc Vitals Group     BP (!) 160/101     Pulse Rate 71     Resp      Temp 98.4 F (36.9 C)     Temp Source Oral     SpO2 100 %     Weight      Height      Pain Score      Pain Loc    Updated Vital Signs BP (!) 160/101 (BP Location: Right Arm)   Pulse 71   Temp 98.4 F (36.9 C) (Oral)   SpO2 100%   Physical Exam  Constitutional: He is oriented to person,  place, and time. No distress.  Alert, nicely groomed  HENT:  Head: Atraumatic.  Eyes:  Conjugate gaze, no eye redness/drainage  Neck: Neck supple.  Cardiovascular: Normal rate and regular rhythm.   Pulmonary/Chest: No respiratory distress. He has no wheezes. He has no rales.  Lungs clear, symmetric breath sounds  Abdominal: He exhibits no distension.  Musculoskeletal: Normal range of motion. He exhibits no edema.  Neurological: He is alert and oriented to person, place, and time.  Skin: Skin is warm and dry.  No cyanosis  Nursing note and vitals reviewed.    UC Treatments / Results  Labs (all labs ordered are listed, but only abnormal results are displayed) Labs Reviewed  CBC WITH DIFFERENTIAL/PLATELET  COMPREHENSIVE METABOLIC PANEL  MAGNESIUM  TSH  T4, FREE  T3, FREE    EKG NSR with occ PACs; no acute appearing ST or T wave changes.  Did not find old ECG to compare, patient thinks he had an ECG in about 2004  Procedures Procedures (including critical care time) None today  Final Clinical Impressions(s) / UC Diagnoses   Final diagnoses:  Intermittent  palpitations  elevated blood pressure  No danger signs on exam or in history tonight.  ECG showed occasional early beats (benign).  Please go to ED if you develop chest discomfort, breathlessness, unusual leg pain/swelling.  Labs are pending, including thyroid tests, liver/kidney/electrolyte tests and blood counts.  The urgent care will contact you if there are any abnormalities that require further treatment.  Prescription for a low dose of propranolol was sent to the pharmacy; you can take this as needed for episodes of palpitations.  Blood pressure was slightly elevated tonight but not in emergent need of treatment.  Will leave discussion of risks/benefits and choice of possible blood pressure agents to cardiologist and/or your primary care provider.    New Prescriptions Discharge Medication List as of 04/03/2017  7:57 PM    START taking these medications   Details  propranolol (INDERAL) 10 MG tablet Take 1 tablet (10 mg total) by mouth 4 (four) times daily as needed (palpitations). Can take up to 2 tablets qid as needed., Starting Wed 04/03/2017, Normal         Sherlene Shams, MD 04/03/17 2334

## 2017-04-03 NOTE — Discharge Instructions (Addendum)
No danger signs on exam or in history tonight.  ECG showed occasional early beats (benign).  Please go to ED if you develop chest discomfort, breathlessness, unusual leg pain/swelling.  Labs are pending, including thyroid tests, liver/kidney/electrolyte tests and blood counts.  The urgent care will contact you if there are any abnormalities that require further treatment.  Prescription for a low dose of propranolol was sent to the pharmacy; you can take this as needed for episodes of palpitations.  Blood pressure was slightly elevated tonight but not in emergent need of treatment.  Will leave discussion of risks/benefits and choice of possible blood pressure agents to cardiologist and/or your primary care provider.

## 2017-04-05 LAB — T3, FREE: T3, Free: 4 pg/mL (ref 2.0–4.4)

## 2017-10-02 ENCOUNTER — Ambulatory Visit (HOSPITAL_COMMUNITY)
Admission: EM | Admit: 2017-10-02 | Discharge: 2017-10-02 | Disposition: A | Discharge status: Home / Self Care | Payer: 59 | Attending: Family Medicine | Admitting: Family Medicine

## 2017-10-02 ENCOUNTER — Other Ambulatory Visit: Payer: Self-pay

## 2017-10-02 DIAGNOSIS — M79601 Pain in right arm: Secondary | ICD-10-CM | POA: Diagnosis not present

## 2017-10-02 MED ORDER — NAPROXEN 500 MG PO TABS: 500.0000 mg | ORAL_TABLET | Freq: Two times a day (BID) | ORAL | 0 refills | Status: AC

## 2017-10-02 MED ORDER — CYCLOBENZAPRINE HCL 5 MG PO TABS
5.0000 mg | ORAL_TABLET | Freq: Every evening | ORAL | 0 refills | Status: DC | PRN
Start: 2017-10-02 — End: 2018-02-13

## 2017-10-02 NOTE — ED Triage Notes (Addendum)
Pain in right arm, right hand tingling, pain in neck on the right side. Per pt he was not lifting anything and was not doing anything to cause pain to his neck or right arm. Arm pain started last night. Per pt pain in arm is not throbbing or tingling its just there...or

## 2017-10-02 NOTE — ED Provider Notes (Signed)
[facility]    CSN: [id number] Arrival date & time: 10/02/17  1458     History   Chief Complaint Chief Complaint  Patient presents with  . Arm Pain    HPI Steven Mullins is a 41 y.o. male.   41 year old male comes in for 1 day history of right arm pain, right hand tingling. Patient states symptoms started yesterday after work, no obvious aggravating factor or alleviating factor. Symptoms resolved when he first woke up today, but then resumed. He took naproxen 220mg  without relief. States he has had right sided neck pain for the past 2-3 days, felt like muscle strain so he didn't think much about it. He states pain is more aching in nature. He has not noticed increase in symptoms with certain neck movement or arm movement. No recent increase in activity, denies heavy lifting. Does states that he can wake up with arm under his head. Currently denies symptoms. Denies chest pain, shortness of breath, diaphoresis, nausea, vomiting, palpitations. Patient walks/cycles for exercise without dyspnea on exertion, angina. No personal history of cardiac disease. States uncle with MI, unsure age but >32 years old. No other family history of MI.       Past Medical History:  Diagnosis Date  . Allergic rhinitis   . Family history of colon cancer in mother   . Family history of prostate cancer   . Obesity   . Prostatitis 03/2016    Patient Active Problem List   Diagnosis Date Noted  . Encounter for health maintenance examination in adult 04/12/2016  . Obesity 04/12/2016  . Elevated blood pressure reading without diagnosis of hypertension 04/12/2016  . Rhinitis, allergic 04/12/2016  . Family history of colon cancer in mother 04/12/2016  . Family history of prostate cancer in father 04/12/2016    Past Surgical History:  Procedure Laterality Date  . APPENDECTOMY    . COLONOSCOPY     pending 1st screen 04/2016  . WISDOM TOOTH EXTRACTION         Home Medications    Prior  to Admission medications   Medication Sig Start Date End Date Taking? Authorizing Provider  cyclobenzaprine (FLEXERIL) 5 MG tablet Take 1-2 tablets (5-10 mg total) by mouth at bedtime as needed for muscle spasms. 10/02/17   Tasia Catchings, Amy V, PA-C  naproxen (NAPROSYN) 500 MG tablet Take 1 tablet (500 mg total) by mouth 2 (two) times daily for 10 days. 10/02/17 10/12/17  Ok Edwards, PA-C    Family History Family History  Problem Relation Age of Onset  . Cancer Mother 59       colon  . Hypertension Mother   . Thyroid disease Mother   . Hypertension Father   . Cancer Father 51       prostate  . Thyroid disease Brother   . Cancer Maternal Uncle        prostate  . Heart disease Maternal Uncle   . Cancer Maternal Grandfather        prostate  . Diabetes Neg Hx   . Stroke Neg Hx     Social History Social History   Tobacco Use  . Smoking status: Never Smoker  . Smokeless tobacco: Never Used  Substance Use Topics  . Alcohol use: Yes    Comment: occasional  . Drug use: No     Allergies   Patient has no known allergies.   Review of Systems Review of Systems  Reason unable to perform ROS: See  HPI as above.     Physical Exam Triage Vital Signs ED Triage Vitals  Enc Vitals Group     BP 10/02/17 1523 (!) 154/101     Pulse Rate 10/02/17 1523 84     Resp --      Temp 10/02/17 1523 98.6 F (37 C)     Temp Source 10/02/17 1523 Oral     SpO2 10/02/17 1523 96 %     Weight --      Height --      Head Circumference --      Peak Flow --      Pain Score 10/02/17 1520 3     Pain Loc --      Pain Edu? --      Excl. in [place]? --    No data found.  Updated Vital Signs BP (!) 154/101 (BP Location: Left Arm)   Pulse 84   Temp 98.6 F (37 C) (Oral)   SpO2 96%   Physical Exam  Constitutional: He is oriented to person, place, and time. He appears well-developed and well-nourished. No distress.  HENT:  Head: Normocephalic and atraumatic.  Eyes: Conjunctivae are normal. Pupils are  equal, round, and reactive to light.  Neck: Normal range of motion. Neck supple. No spinous process tenderness and no muscular tenderness present. Normal range of motion present.  Neck ROM illicit pain.   Cardiovascular: Normal rate, regular rhythm and normal heart sounds. Exam reveals no gallop and no friction rub.  No murmur heard. Pulmonary/Chest: Effort normal and breath sounds normal. He has no wheezes. He has no rales.  Musculoskeletal:  No tenderness on palpation of the shoulder, elbow, wrist, fingers. Full range of motion. Strength normal and equal bilaterally. Sensation intact and equal bilaterally. Negative tinel, phalen, finkelstein.   Radial pulses 2+ and equal bilaterally. Capillary refill less than 2 seconds.   Neurological: He is alert and oriented to person, place, and time.  Skin: Skin is warm and dry.     UC Treatments / Results  Labs (all labs ordered are listed, but only abnormal results are displayed) Labs Reviewed - No data to display  EKG  EKG Interpretation None       Radiology No results found.  Procedures Procedures (including critical care time)  Medications Ordered in UC Medications - No data to display   Initial Impression / Assessment and Plan / UC Course  I have reviewed the triage vital signs and the nursing notes.  Pertinent labs & imaging results that were available during my care of the patient were reviewed by me and considered in my medical decision making (see chart for details).    Discussed possible inflammation causing symptoms. Start naproxen as directed. Muscle relaxant as needed. Follow up with PCP for further evaluation if symptoms does not resolve. Return precautions given.   Final Clinical Impressions(s) / UC Diagnoses   Final diagnoses:  Right arm pain    ED Discharge Orders        Ordered    naproxen (NAPROSYN) 500 MG tablet  2 times daily     10/02/17 1548    cyclobenzaprine (FLEXERIL) 5 MG tablet  At bedtime PRN      10/02/17 1549        Ok Edwards, PA-C 10/02/17 1559

## 2017-10-02 NOTE — Discharge Instructions (Signed)
Symptoms could be due to inflammation Start naproxen as directed. Flexeril as needed at night. Flexeril can make you drowsy, so do not take if you are going to drive, operate heavy machinery, or make important decisions. Ice/heat compresses as needed. This can take up to 3-4 weeks to completely resolve, but you should be feeling better each week. Follow up here or with PCP if symptoms worsen, changes for reevaluation. If experiencing worsening symptoms, chest pain, shortness of breath, sweating, lightheadedness, go to the emergency department for further evaluation.

## 2017-11-12 DIAGNOSIS — I10 Essential (primary) hypertension: Secondary | ICD-10-CM

## 2017-11-12 HISTORY — DX: Essential (primary) hypertension: I10

## 2018-01-10 ENCOUNTER — Ambulatory Visit (INDEPENDENT_AMBULATORY_CARE_PROVIDER_SITE_OTHER): Payer: 59 | Admitting: Medical

## 2018-01-10 ENCOUNTER — Encounter: Payer: Self-pay | Admitting: Medical

## 2018-01-10 VITALS — BP 136/86 | HR 62 | Wt 255.6 lb

## 2018-01-10 DIAGNOSIS — N529 Male erectile dysfunction, unspecified: Secondary | ICD-10-CM | POA: Diagnosis not present

## 2018-01-10 DIAGNOSIS — E669 Obesity, unspecified: Secondary | ICD-10-CM | POA: Diagnosis not present

## 2018-01-10 DIAGNOSIS — N50819 Testicular pain, unspecified: Secondary | ICD-10-CM

## 2018-01-10 DIAGNOSIS — M545 Low back pain: Secondary | ICD-10-CM

## 2018-01-10 DIAGNOSIS — I10 Essential (primary) hypertension: Secondary | ICD-10-CM | POA: Insufficient documentation

## 2018-01-10 LAB — POCT URINALYSIS DIP (PROADVANTAGE DEVICE)
BILIRUBIN UA: NEGATIVE
BILIRUBIN UA: NEGATIVE mg/dL
Glucose, UA: NEGATIVE mg/dL
Leukocytes, UA: NEGATIVE
NITRITE UA: NEGATIVE
Protein Ur, POC: NEGATIVE mg/dL
RBC UA: NEGATIVE
SPECIFIC GRAVITY, URINE: 1.02
Urobilinogen, Ur: NEGATIVE
pH, UA: 6.5 (ref 5.0–8.0)

## 2018-01-10 MED ORDER — CIPROFLOXACIN HCL 500 MG PO TABS: 500.0000 mg | ORAL_TABLET | Freq: Two times a day (BID) | ORAL | 0 refills | Status: DC

## 2018-01-10 MED ORDER — AMLODIPINE BESYLATE 10 MG PO TABS: 10.0000 mg | ORAL_TABLET | Freq: Every day | ORAL | 2 refills | Status: DC

## 2018-01-10 NOTE — Assessment & Plan Note (Signed)
Unclear etiology, discussed possible differential.   No obvious lump or mass, no concern for STD

## 2018-01-10 NOTE — Assessment & Plan Note (Signed)
Erectile Dysfunction - Reviewed pathophysiology and differential diagnosis of erectile dysfunction with the patient.  Discussed treatment options.  consdier trial of Viagra.  Discussed potential risks of medications including hypotension and priapism.  Discussed proper use of medication.  Questions were answered.

## 2018-01-10 NOTE — Progress Notes (Signed)
Subjective:    Patient ID: Steven Mullins, male    DOB: March 11, 1976, 42 y.o.   MRN: [id number]  HPI Here today for a couple different complaints.  He reports that he sits a lot at his job.  Has a wore out chair over time.   Was having left hip and low back pain.   Has been feeling some pressure in the scrotum.  He thought it was related to the chair and has since gotten a new chair at work.  Since the new chair his back and hip pain is calmed down but he still has the right testicle pain or discomfort.  He denies swelling, he denies lump, denies penile discharge, denies urinary changes.  No odor in the urine or dark urine.  No concern for STD.  No injury no numbness or tingling in the legs or pain in the legs.  No history of back issues otherwise.  Is more important concern is his sexual function had diminished in last 2 weeks.  He notes in the last 2 weeks he has had problems getting and keeping erections.  He has not had this problem before.  He denies any marital issues or does not attribute this to stress  Denies chest pain, shortness of breath, dyspnea, edema.  His weight has fluctuated over the last few years and he is actually tried to lose weight and is back down compared to a high of 277 pounds in the past year he is not checking his blood pressure but it has been elevated in the past   Past Medical History:  Diagnosis Date  . Allergic rhinitis   . Family history of colon cancer in mother   . Family history of prostate cancer   . Obesity   . Prostatitis 03/2016   Current Outpatient Medications on File Prior to Visit  Medication Sig Dispense Refill  . cyclobenzaprine (FLEXERIL) 5 MG tablet Take 1-2 tablets (5-10 mg total) by mouth at bedtime as needed for muscle spasms. (Patient not taking: Reported on 01/10/2018) 15 tablet 0   No current facility-administered medications on file prior to visit.    Family History  Problem Relation Age of Onset  . Cancer Mother 9       colon    . Hypertension Mother   . Thyroid disease Mother   . Hypertension Father   . Cancer Father 31       prostate  . Thyroid disease Brother   . Cancer Maternal Uncle        prostate  . Heart disease Maternal Uncle   . Cancer Maternal Grandfather        prostate  . Diabetes Neg Hx   . Stroke Neg Hx     Review of Systems As in subjective     Objective:   Physical Exam  Constitutional: He is oriented to person, place, and time. He appears well-developed and well-nourished.  Eyes: Conjunctivae and EOM are normal. Pupils are equal, round, and reactive to light.  Neck: Normal range of motion. Neck supple. No JVD present. No thyromegaly present.  Cardiovascular: Normal rate, regular rhythm, normal heart sounds and intact distal pulses. Exam reveals no gallop and no friction rub.  No murmur heard. Abdominal: Soft. Bowel sounds are normal. He exhibits no distension and no mass. There is no tenderness. There is no rebound and no guarding.  Genitourinary: Penis normal. No penile tenderness.  Genitourinary Comments: No testicular mass, no swelling, nontender on exam, no hernias,  no lymphadenopathy, circumcised  Musculoskeletal: Normal range of motion.  Lymphadenopathy:    He has no cervical adenopathy.  Neurological: He is alert and oriented to person, place, and time. He has normal reflexes. He displays normal reflexes. No cranial nerve deficit.  Skin: Skin is warm and dry.  Psychiatric: He has a normal mood and affect. His behavior is normal.   BP 136/86   Pulse 62   Wt 255 lb 9.6 oz (115.9 kg)   SpO2 99%   BMI 40.64 kg/m   Wt Readings from Last 3 Encounters:  01/10/18 255 lb 9.6 oz (115.9 kg)  04/12/16 266 lb (120.7 kg)  03/22/16 270 lb (122.5 kg)   BP Readings from Last 3 Encounters:  01/10/18 136/86  10/02/17 (!) 154/101  04/03/17 (!) 160/101    My blood pressure reading today was 138/90 right arm with a large cuff.   Adult ECG Report  Indication: elevated BP  Rate:  61 bpm  Rhythm: normal sinus rhythm  QRS Axis: 53 degrees  PR Interval: 166 ms  QRS Duration: 51ms  QTc: 335ms  Conduction Disturbances: none  Other Abnormalities: ST elevation, likely due to early repolarization  Patient's cardiac risk factors are: hypertension and obesity (BMI >= 30 kg/m2).  EKG comparison: 03/2017  Narrative Interpretation: unchanged       Assessment & Plan:   Encounter Diagnoses  Name Primary?  . Testicle pain Yes  . Erectile dysfunction, unspecified erectile dysfunction type   . Essential hypertension, benign   . Low back pain, unspecified back pain laterality, unspecified chronicity, with sciatica presence unspecified   . Obesity, unspecified classification, unspecified obesity type, unspecified whether serious comorbidity present    Testicle pain Unclear etiology, discussed possible differential.   No obvious lump or mass, no concern for STD  Obesity counseled on diet, exercise, and c/t efforts to lose weight  Erectile dysfunction Erectile Dysfunction - Reviewed pathophysiology and differential diagnosis of erectile dysfunction with the patient.  Discussed treatment options.  consdier trial of Viagra.  Discussed potential risks of medications including hypotension and priapism.  Discussed proper use of medication.  Questions were answered.    Essential hypertension, benign Discussed diagnosis and possible complications.   Discussed goals of therapy.  Begin trial of amlodipine.  Discussed risks/benefits of medication.  F/u 32mo.  [place] was seen today for testicle pain.  Diagnoses and all orders for this visit:  Testicle pain -     POCT Urinalysis DIP (Proadvantage Device)  Erectile dysfunction, unspecified erectile dysfunction type  Essential hypertension, benign -     EKG 12-Lead  Low back pain, unspecified back pain laterality, unspecified chronicity, with sciatica presence unspecified  Obesity, unspecified classification, unspecified obesity  type, unspecified whether serious comorbidity present -     EKG 12-Lead  Other orders -     ciprofloxacin (CIPRO) 500 MG tablet; Take 1 tablet (500 mg total) by mouth 2 (two) times daily. -     amLODipine (NORVASC) 10 MG tablet; Take 1 tablet (10 mg total) by mouth daily.

## 2018-01-10 NOTE — Patient Instructions (Signed)
Recommendations  Begin amlodipine blood pressure tablet 1/2 tablet a day for the first 2 weeks then change to 1 tablet daily  Begin Cipro antibiotic twice daily for 2 weeks for prostate infection  Drink plenty of water, limit salt, work on healthy diet and exercise  Let us plan to see you back in 4-5 weeks unless you need to come back sooner  I recommend exercising most days of the week using a type of exercise that they would enjoy and stick to such as walking, running, swimming, hiking, biking, aerobics, etc.  I recommend a healthy diet.    Do's:   whole grains such as whole grain pasta, rice, whole grains breads and whole grain cereals.  Use small quantities such as 1/2 cup per serving or 2 slices of bread per serving.    Eat 3-5 fruits daily  Eat beans at least once daily  Eat almonds in small quantities at least 3 days per week    If they eat meat, I recommend small portions of lean meats such as chicken, fish, and [place].  Eat as much NON corn and NON potato vegetables as they like, particularly raw or steamed  Drink several large glasses of water daily  Cautions:  Limit red meat  Limit corn and potatoes  Limit sweets, cake, pie, candy  Limit beer and alcohol  Avoid fried food, fast food, large portions  Avoid sugary drinks such as regular soda and sweet tea

## 2018-01-10 NOTE — Assessment & Plan Note (Signed)
counseled on diet, exercise, and c/t efforts to lose weight

## 2018-01-10 NOTE — Assessment & Plan Note (Signed)
Discussed diagnosis and possible complications.   Discussed goals of therapy.  Begin trial of amlodipine.  Discussed risks/benefits of medication.  F/u 36mo.

## 2018-02-12 ENCOUNTER — Encounter: Payer: Self-pay | Admitting: Medical

## 2018-02-12 ENCOUNTER — Ambulatory Visit (INDEPENDENT_AMBULATORY_CARE_PROVIDER_SITE_OTHER): Payer: 59 | Admitting: Medical

## 2018-02-12 VITALS — BP 118/88 | HR 73 | Ht 66.5 in | Wt 256.8 lb

## 2018-02-12 DIAGNOSIS — Z8042 Family history of malignant neoplasm of prostate: Secondary | ICD-10-CM

## 2018-02-12 DIAGNOSIS — Z800 Family history of malignant neoplasm of digestive organs: Secondary | ICD-10-CM

## 2018-02-12 DIAGNOSIS — Z8349 Family history of other endocrine, nutritional and metabolic diseases: Secondary | ICD-10-CM | POA: Diagnosis not present

## 2018-02-12 DIAGNOSIS — N529 Male erectile dysfunction, unspecified: Secondary | ICD-10-CM

## 2018-02-12 DIAGNOSIS — J301 Allergic rhinitis due to pollen: Secondary | ICD-10-CM

## 2018-02-12 DIAGNOSIS — I10 Essential (primary) hypertension: Secondary | ICD-10-CM

## 2018-02-12 DIAGNOSIS — E669 Obesity, unspecified: Secondary | ICD-10-CM | POA: Diagnosis not present

## 2018-02-12 DIAGNOSIS — Z0000 Encounter for general adult medical examination without abnormal findings: Secondary | ICD-10-CM

## 2018-02-12 NOTE — Progress Notes (Signed)
Subjective:   HPI  Steven Mullins is a 42 y.o. male who presents for Chief Complaint  Patient presents with  . Annual Exam    no concerns, fasting     Medical care team includes: Imogene Gravelle, Camelia Eng, PA-C here for primary care Dentist Eye doctor  Concerns: No specific concerns  Checks BP once weekly.  127/83 at home recently, usually normal numbers at home.    Reviewed their medical, surgical, family, social, medication, and allergy history and updated chart as appropriate.  Past Medical History:  Diagnosis Date  . Allergic rhinitis   . Colon polyp 04/2016  . Erectile dysfunction   . Family history of colon cancer in mother   . Family history of prostate cancer   . Hypertension 2019  . Obesity   . Prostatitis 03/2016    Past Surgical History:  Procedure Laterality Date  . APPENDECTOMY     age 53yo  . COLONOSCOPY  04/2016   serrated polyp, repeat in 2022; Dr. Carol Ada  . WISDOM TOOTH EXTRACTION      Social History   Socioeconomic History  . Marital status: Married    Spouse name: Not on file  . Number of children: Not on file  . Years of education: Not on file  . Highest education level: Not on file  Occupational History  . Not on file  Social Needs  . Financial resource strain: Not on file  . Food insecurity:    Worry: Not on file    Inability: Not on file  . Transportation needs:    Medical: Not on file    Non-medical: Not on file  Tobacco Use  . Smoking status: Never Smoker  . Smokeless tobacco: Never Used  Substance and Sexual Activity  . Alcohol use: Yes    Comment: occasional  . Drug use: No  . Sexual activity: Not on file  Lifestyle  . Physical activity:    Days per week: Not on file    Minutes per session: Not on file  . Stress: Not on file  Relationships  . Social connections:    Talks on phone: Not on file    Gets together: Not on file    Attends religious service: Not on file    Active member of club or organization: Not on  file    Attends meetings of clubs or organizations: Not on file    Relationship status: Not on file  . Intimate partner violence:    Fear of current or ex partner: Not on file    Emotionally abused: Not on file    Physically abused: Not on file    Forced sexual activity: Not on file  Other Topics Concern  . Not on file  Social History Narrative   Married, 2 children, goes to gym, lots of walking, some weights, works for customer service [employer].  02/2018    Family History  Problem Relation Age of Onset  . Cancer Mother 28       colon  . Hypertension Mother   . Thyroid disease Mother   . Hypertension Father   . Cancer Father 27       prostate  . Thyroid disease Brother   . Cancer Maternal Uncle        prostate  . Heart disease Maternal Uncle   . Cancer Maternal Grandfather        prostate  . Cancer Paternal Aunt   . Diabetes Cousin   . Diabetes Cousin   .  Stroke Neg Hx      Current Outpatient Medications:  .  amLODipine (NORVASC) 10 MG tablet, Take 1 tablet (10 mg total) by mouth daily., Disp: 30 tablet, Rfl: 2 .  ciprofloxacin (CIPRO) 500 MG tablet, Take 1 tablet (500 mg total) by mouth 2 (two) times daily. (Patient not taking: Reported on 02/12/2018), Disp: 28 tablet, Rfl: 0 .  cyclobenzaprine (FLEXERIL) 5 MG tablet, Take 1-2 tablets (5-10 mg total) by mouth at bedtime as needed for muscle spasms. (Patient not taking: Reported on 01/10/2018), Disp: 15 tablet, Rfl: 0  No Known Allergies  Review of Systems Constitutional: -fever, -chills, -sweats, -unexpected weight change, -decreased appetite, -fatigue Allergy: -sneezing, -itching, -congestion Dermatology: -changing moles, --rash, -lumps ENT: -runny nose, -ear pain, -sore throat, -hoarseness, -sinus pain, -teeth pain, - ringing in ears, -hearing loss, -nosebleeds Cardiology: -chest pain, -palpitations, -swelling, -difficulty breathing when lying flat, -waking up short of breath Respiratory: -cough, -shortness of breath,  -difficulty breathing with exercise or exertion, -wheezing, -coughing up blood Gastroenterology: -abdominal pain, -nausea, -vomiting, -diarrhea, -constipation, -blood in stool, -changes in bowel movement, -difficulty swallowing or eating Hematology: -bleeding, -bruising  Musculoskeletal: -joint aches, -muscle aches, -joint swelling, -back pain, -neck pain, -cramping, -changes in gait Ophthalmology: denies vision changes, eye redness, itching, discharge Urology: -burning with urination, -difficulty urinating, -blood in urine, -urinary frequency, -urgency, -incontinence Neurology: -headache, -weakness, -tingling, -numbness, -memory loss, -falls, -dizziness Psychology: -depressed mood, -agitation, -sleep problems Male GU: no testicular mass, pain, no lymph nodes swollen, no swelling, no rash.     Objective:  BP 118/88 (BP Location: Right Arm, Patient Position: Sitting, Cuff Size: Normal)   Pulse 73   Ht 5' 6.5" (1.689 m)   Wt 256 lb 12.8 oz (116.5 kg)   SpO2 98%   BMI 40.83 kg/m    Wt Readings from Last 3 Encounters:  02/12/18 256 lb 12.8 oz (116.5 kg)  01/10/18 255 lb 9.6 oz (115.9 kg)  04/12/16 266 lb (120.7 kg)   BP Readings from Last 3 Encounters:  02/12/18 118/88  01/10/18 136/86  10/02/17 (!) 154/101    General appearance: alert, no distress, WD/WN, African American male Skin: no worrisome lesions HEENT: normocephalic, conjunctiva/corneas normal, sclerae anicteric, PERRLA, EOMi, nares patent, no discharge or erythema, pharynx normal Oral cavity: MMM, tongue normal, teeth in good repair Neck: supple, no lymphadenopathy, no thyromegaly, no masses, normal ROM, no bruits Chest: non tender, normal shape and expansion Heart: RRR, normal S1, S2, no murmurs Lungs: CTA bilaterally, no wheezes, rhonchi, or rales Abdomen: +bs, soft, non tender, non distended, no masses, no hepatomegaly, no splenomegaly, no bruits Back: non tender, normal ROM, no scoliosis Musculoskeletal: upper  extremities non tender, no obvious deformity, normal ROM throughout, lower extremities non tender, no obvious deformity, normal ROM throughout Extremities: no edema, no cyanosis, no clubbing Pulses: 2+ symmetric, upper and lower extremities, normal cap refill Neurological: alert, oriented x 3, CN2-12 intact, strength normal upper extremities and lower extremities, sensation normal throughout, DTRs 2+ throughout, no cerebellar signs, gait normal Psychiatric: normal affect, behavior normal, pleasant  GU: normal male external genitalia,circumcised, nontender, no masses, no hernia, no lymphadenopathy Rectal: deferred   Assessment and Plan :   Encounter Diagnoses  Name Primary?  . Encounter for health maintenance examination in adult Yes  . Essential hypertension, benign   . Allergic rhinitis due to pollen, unspecified seasonality   . Erectile dysfunction, unspecified erectile dysfunction type   . Family history of colon cancer in mother   . Family history  of prostate cancer in father   . Obesity, unspecified classification, unspecified obesity type, unspecified whether serious comorbidity present   . Family history of thyroid disease     Physical exam - discussed and counseled on healthy lifestyle, diet, exercise, preventative care, vaccinations, sick and well care, proper use of emergency dept and after hours care, and addressed their concerns.    Health screening: See your eye doctor yearly for routine vision care. See your dentist yearly for routine dental care including hygiene visits twice yearly.  Cancer screening Advised monthly self testicular exam  Colonoscopy:  Repeat colonoscopy 2022  Discussed PSA, prostate exam, and prostate cancer screening risks/benefits.     Vaccinations: Advised yearly influenza vaccine  Separate significant chronic issues discussed: C/t current BP medication. Counseled on setting weight loss goals, working towards these goals.   [place] was  seen today for annual exam.  Diagnoses and all orders for this visit:  Encounter for health maintenance examination in adult -     Hemoglobin A1c -     PSA -     TSH -     CBC -     Basic metabolic panel  Essential hypertension, benign  Allergic rhinitis due to pollen, unspecified seasonality  Erectile dysfunction, unspecified erectile dysfunction type  Family history of colon cancer in mother  Family history of prostate cancer in father  Obesity, unspecified classification, unspecified obesity type, unspecified whether serious comorbidity present  Family history of thyroid disease    Follow-up pending labs, yearly for physical

## 2018-02-12 NOTE — Patient Instructions (Signed)
Thanks for trusting [place] with your health care and for coming in for a physical today.  Below are some general recommendations I have for you:  Yearly screenings See your eye doctor yearly for routine vision care. See your dentist yearly for routine dental care including hygiene visits twice yearly. See me here yearly for a routine physical and preventative care visit   Specific Concerns today:  . We will call with lab results Counseled on monthly self testicular exam Plan to have repeat colonoscopy in 2022 We will check as PSA prostate lab today Continue to monitor your blood pressures 1-2 times per week.  The goal is to keep the numbers around 120/70.   anything over 140/90 is too high. I want to challenge you to set a goal for the next 30 days such as losing 5 pounds.   I would like you to keep a food diary.  I would recommend you use an app on the smart phone such as My Fitness Pal or similar fitness app on the smart phone to track their diet and progress.   I recommend you limit their calories to 1900/day for right now Cut out any obvious junk food or high sugar foods such as sweets, desserts, sweet tea, soda, and alcohol.  Cut back on breads and pasta type foods.  Drink more water throughout the day.   Try to get at least 150 minutes of exercise per week.  Please follow up yearly for a physical.   Preventative Care for Adults - Male      [place]:  A routine yearly physical is a good way to check in with your primary care provider about your health and preventive screening. It is also an opportunity to share updates about your health and any concerns you have, and receive a thorough all-over exam.   Most health insurance companies pay for at least some preventative services.  Check with your health plan for specific coverages.  WHAT PREVENTATIVE SERVICES DO WOMEN NEED?  Adult men should have their weight and blood pressure checked regularly.   Men age 30  and older should have their cholesterol levels checked regularly.  Beginning at age 28 and continuing to age 21, men should be screened for colorectal cancer.  Certain people may need continued testing until age 51.  Updating vaccinations is part of preventative care.  Vaccinations help protect against diseases such as the flu.  Osteoporosis is a disease in which the bones lose minerals and strength as we age. Men ages 37 and over should discuss this with their caregivers  Lab tests are generally done as part of preventative care to screen for anemia and blood disorders, to screen for problems with the kidneys and liver, to screen for bladder problems, to check blood sugar, and to check your cholesterol level.  Preventative services generally include counseling about diet, exercise, avoiding tobacco, drugs, excessive alcohol consumption, and sexually transmitted infections.    GENERAL RECOMMENDATIONS FOR GOOD HEALTH:  Healthy diet:  Eat a variety of foods, including fruit, vegetables, animal or vegetable protein, such as meat, fish, chicken, and eggs, or beans, lentils, tofu, and grains, such as rice.  Drink plenty of water daily.  Decrease saturated fat in the diet, avoid lots of red meat, processed foods, sweets, fast foods, and fried foods.  Exercise:  Aerobic exercise helps maintain good heart health. At least 30-40 minutes of moderate-intensity exercise is recommended. For example, a brisk walk that increases your heart  rate and breathing. This should be done on most days of the week.   Find a type of exercise or a variety of exercises that you enjoy so that it becomes a part of your daily life.  Examples are running, walking, swimming, water aerobics, and biking.  For motivation and support, explore group exercise such as aerobic class, spin class, Zumba, Yoga,or  martial arts, etc.    Set exercise goals for yourself, such as a certain weight goal, walk or run in a race such as a 5k  walk/run.  Speak to your primary care provider about exercise goals.  Disease prevention:  If you smoke or chew tobacco, find out from your caregiver how to quit. It can literally save your life, no matter how long you have been a tobacco user. If you do not use tobacco, never begin.   Maintain a healthy diet and normal weight. Increased weight leads to problems with blood pressure and diabetes.   The Body Mass Index or BMI is a way of measuring how much of your body is fat. Having a BMI above 27 increases the risk of heart disease, diabetes, hypertension, stroke and other problems related to obesity. Your caregiver can help determine your BMI and based on it develop an exercise and dietary program to help you achieve or maintain this important measurement at a healthful level.  High blood pressure causes heart and blood vessel problems.  Persistent high blood pressure should be treated with medicine if weight loss and exercise do not work.   Fat and cholesterol leaves deposits in your arteries that can block them. This causes heart disease and vessel disease elsewhere in your body.  If your cholesterol is found to be high, or if you have heart disease or certain other medical conditions, then you may need to have your cholesterol monitored frequently and be treated with medication.   Ask if you should have a cardiac stress test if your history suggests this. A stress test is a test done on a treadmill that looks for heart disease. This test can find disease prior to there being a problem.  Osteoporosis is a disease in which the bones lose minerals and strength as we age. This can result in serious bone fractures. Risk of osteoporosis can be identified using a bone density scan. Men ages 46 and over should discuss this with their caregivers. Ask your caregiver whether you should be taking a calcium supplement and Vitamin D, to reduce the rate of osteoporosis.   Avoid drinking alcohol in excess  (more than two drinks per day).  Avoid use of street drugs. Do not share needles with anyone. Ask for professional help if you need assistance or instructions on stopping the use of alcohol, cigarettes, and/or drugs.  Brush your teeth twice a day with fluoride toothpaste, and floss once a day. Good oral hygiene prevents tooth decay and gum disease. The problems can be painful, unattractive, and can cause other health problems. Visit your dentist for a routine oral and dental check up and preventive care every 6-12 months.   Look at your skin regularly.  Use a mirror to look at your back. Notify your caregivers of changes in moles, especially if there are changes in shapes, colors, a size larger than a pencil eraser, an irregular border, or development of new moles.  Safety:  Use seatbelts 100% of the time, whether driving or as a passenger.  Use safety devices such as hearing protection if you work  in environments with loud noise or significant background noise.  Use safety glasses when doing any work that could send debris in to the eyes.  Use a helmet if you ride a bike or motorcycle.  Use appropriate safety gear for contact sports.  Talk to your caregiver about gun safety.  Use sunscreen with a SPF (or skin protection factor) of 15 or greater.  Lighter skinned people are at a greater risk of skin cancer. Don't forget to also wear sunglasses in order to protect your eyes from too much damaging sunlight. Damaging sunlight can accelerate cataract formation.   Practice safe sex. Use condoms. Condoms are used for birth control and to help reduce the spread of sexually transmitted infections (or STIs).  Some of the STIs are gonorrhea (the clap), chlamydia, syphilis, trichomonas, herpes, HPV (human papilloma virus) and HIV (human immunodeficiency virus) which causes AIDS. The herpes, HIV and HPV are viral illnesses that have no cure. These can result in disability, cancer and death.   Keep carbon monoxide  and smoke detectors in your home functioning at all times. Change the batteries every 6 months or use a model that plugs into the wall.   Vaccinations:  Stay up to date with your tetanus shots and other required immunizations. You should have a booster for tetanus every 10 years. Be sure to get your flu shot every year, since 5%-20% of the [place] population comes down with the flu. The flu vaccine changes each year, so being vaccinated once is not enough. Get your shot in the fall, before the flu season peaks.   Other vaccines to consider:  Human Papilloma Virus or HPV causes cancer of the cervix, and other infections that can be transmitted from person to person. There is a vaccine for HPV, and males should get immunized between the ages of 63 and 25. It requires a series of 3 shots.   Pneumococcal vaccine to protect against certain types of pneumonia.  This is normally recommended for adults age 75 or older.  However, adults younger than 44 years old with certain underlying conditions such as diabetes, heart or lung disease should also receive the vaccine.  Shingles vaccine to protect against Varicella Zoster if you are older than age 33, or younger than 44 years old with certain underlying illness.  If you have not had the Shingrix vaccine, please call your insurer to inquire about coverage for the Shingrix vaccine given in 2 doses.   Some insurers cover this vaccine after age 34, some cover this after age 4.  If your insurer covers this, then call to schedule appointment to have this vaccine here  Hepatitis A vaccine to protect against a form of infection of the liver by a virus acquired from food.  Hepatitis B vaccine to protect against a form of infection of the liver by a virus acquired from blood or body fluids, particularly if you work in health care.  If you plan to travel internationally, check with your local health department for specific vaccination recommendations.   What should I  know about cancer screening? Many types of cancers can be detected early and may often be prevented. Lung Cancer  You should be screened every year for lung cancer if: ? You are a current smoker who has smoked for at least 30 years. ? You are a former smoker who has quit within the past 15 years.  Talk to your health care provider about your screening options, when you should start screening,  and how often you should be screened.  Colorectal Cancer  Routine colorectal cancer screening usually begins at 44 years of age and should be repeated every 5-10 years until you are 44 years old. You may need to be screened more often if early forms of precancerous polyps or small growths are found. Your health care provider may recommend screening at an earlier age if you have risk factors for colon cancer.  Your health care provider may recommend using home test kits to check for hidden blood in the stool.  A small camera at the end of a tube can be used to examine your colon (sigmoidoscopy or colonoscopy). This checks for the earliest forms of colorectal cancer.  Prostate and Testicular Cancer  Depending on your age and overall health, your health care provider may do certain tests to screen for prostate and testicular cancer.  Talk to your health care provider about any symptoms or concerns you have about testicular or prostate cancer.  Skin Cancer  Check your skin from head to toe regularly.  Tell your health care provider about any new moles or changes in moles, especially if: ? There is a change in a mole's size, shape, or color. ? You have a mole that is larger than a pencil eraser.  Always use sunscreen. Apply sunscreen liberally and repeat throughout the day.  Protect yourself by wearing long sleeves, pants, a wide-brimmed hat, and sunglasses when outside.

## 2018-02-13 ENCOUNTER — Telehealth: Payer: Self-pay

## 2018-02-13 ENCOUNTER — Other Ambulatory Visit: Payer: Self-pay | Admitting: Medical

## 2018-02-13 LAB — CBC
HEMATOCRIT: 45.1 % (ref 37.5–51.0)
HEMOGLOBIN: 15.3 g/dL (ref 13.0–17.7)
MCH: 30.1 pg (ref 26.6–33.0)
MCHC: 33.9 g/dL (ref 31.5–35.7)
MCV: 89 fL (ref 79–97)
Platelets: 260 10*3/uL (ref 150–379)
RBC: 5.09 x10E6/uL (ref 4.14–5.80)
RDW: 13.3 % (ref 12.3–15.4)
WBC: 6.5 10*3/uL (ref 3.4–10.8)

## 2018-02-13 LAB — BASIC METABOLIC PANEL
BUN/Creatinine Ratio: 16 (ref 9–20)
BUN: 15 mg/dL (ref 6–24)
CALCIUM: 9.3 mg/dL (ref 8.7–10.2)
CHLORIDE: 100 mmol/L (ref 96–106)
CO2: 23 mmol/L (ref 20–29)
Creatinine, Ser: 0.92 mg/dL (ref 0.76–1.27)
GFR, EST AFRICAN AMERICAN: 117 mL/min/{1.73_m2} (ref >59)
GFR, EST NON AFRICAN AMERICAN: 102 mL/min/{1.73_m2} (ref >59)
Glucose: 86 mg/dL (ref 65–99)
POTASSIUM: 4.2 mmol/L (ref 3.5–5.2)
SODIUM: 140 mmol/L (ref 134–144)

## 2018-02-13 LAB — PSA: Prostate Specific Ag, Serum: 0.3 ng/mL (ref 0.0–4.0)

## 2018-02-13 LAB — HEMOGLOBIN A1C
Est. average glucose Bld gHb Est-mCnc: 105 mg/dL
Hgb A1c MFr Bld: 5.3 % (ref 4.8–5.6)

## 2018-02-13 LAB — TSH: TSH: 0.948 u[IU]/mL (ref 0.450–4.500)

## 2018-02-13 MED ORDER — AMLODIPINE BESYLATE 10 MG PO TABS: 10.0000 mg | ORAL_TABLET | Freq: Every day | ORAL | 3 refills | Status: DC

## 2018-02-13 NOTE — Telephone Encounter (Signed)
LVM, gave lab results. Gave call back number if any questions or concerns.  

## 2018-02-13 NOTE — Telephone Encounter (Signed)
-----   Message from Carlena Hurl, PA-C sent at 02/13/2018  7:46 AM EDT ----- I am happy to report that all of his labs were normal!  Continue same BP medication, work on health diet, exercise, and set some fitness and weight loss goals.  Follow up yearly for physical.   I would like to have follow up in 3 months by phone or visit or my chart to know he is making progress towards losing weight and fitness goals.

## 2018-02-18 ENCOUNTER — Other Ambulatory Visit: Payer: Self-pay | Admitting: Medical

## 2018-02-18 MED ORDER — AMLODIPINE BESYLATE 10 MG PO TABS: 10.0000 mg | ORAL_TABLET | Freq: Every day | ORAL | 3 refills | Status: DC

## 2018-05-13 ENCOUNTER — Ambulatory Visit: Payer: Self-pay | Admitting: Medical

## 2018-08-11 ENCOUNTER — Telehealth: Payer: Self-pay | Admitting: Medical

## 2018-08-11 NOTE — Telephone Encounter (Signed)
[facility] for a comprehensive health screen.  I am not sure if this was mandated by his employer but we did a physical on him in April 2019 so this was a duplication.  Please call and inquire about this and encouraged him to follow-up yearly for physical here

## 2018-08-11 NOTE — Telephone Encounter (Signed)
Left message on voicemail for patient to call back. 

## 2018-12-26 ENCOUNTER — Other Ambulatory Visit: Payer: Self-pay | Admitting: Medical

## 2018-12-29 NOTE — Telephone Encounter (Signed)
Is this ok to refill?  

## 2018-12-30 ENCOUNTER — Telehealth: Payer: Self-pay | Admitting: Medical

## 2018-12-30 NOTE — Telephone Encounter (Signed)
Schedule physical for 02/2019

## 2018-12-31 NOTE — Telephone Encounter (Signed)
Pt is coming in May 29/20

## 2019-03-05 ENCOUNTER — Telehealth: Payer: Self-pay | Admitting: Medical

## 2019-03-05 NOTE — Telephone Encounter (Signed)
schecule CPX for May

## 2019-03-06 NOTE — Telephone Encounter (Signed)
Patient already has an appointment.

## 2019-03-22 ENCOUNTER — Other Ambulatory Visit: Payer: Self-pay | Admitting: Medical

## 2019-04-10 ENCOUNTER — Encounter: Payer: Self-pay | Admitting: Medical

## 2019-04-10 ENCOUNTER — Ambulatory Visit (INDEPENDENT_AMBULATORY_CARE_PROVIDER_SITE_OTHER): Payer: No Typology Code available for payment source | Admitting: Medical

## 2019-04-10 ENCOUNTER — Other Ambulatory Visit: Payer: Self-pay

## 2019-04-10 VITALS — BP 126/80 | HR 80 | Temp 98.4°F | Resp 16 | Ht 67.0 in | Wt 256.0 lb

## 2019-04-10 DIAGNOSIS — Z0000 Encounter for general adult medical examination without abnormal findings: Secondary | ICD-10-CM | POA: Diagnosis not present

## 2019-04-10 DIAGNOSIS — Z800 Family history of malignant neoplasm of digestive organs: Secondary | ICD-10-CM

## 2019-04-10 DIAGNOSIS — E669 Obesity, unspecified: Secondary | ICD-10-CM

## 2019-04-10 DIAGNOSIS — I10 Essential (primary) hypertension: Secondary | ICD-10-CM | POA: Diagnosis not present

## 2019-04-10 DIAGNOSIS — N529 Male erectile dysfunction, unspecified: Secondary | ICD-10-CM

## 2019-04-10 DIAGNOSIS — Z8349 Family history of other endocrine, nutritional and metabolic diseases: Secondary | ICD-10-CM

## 2019-04-10 DIAGNOSIS — J301 Allergic rhinitis due to pollen: Secondary | ICD-10-CM

## 2019-04-10 DIAGNOSIS — Z8042 Family history of malignant neoplasm of prostate: Secondary | ICD-10-CM

## 2019-04-10 LAB — POCT URINALYSIS DIP (PROADVANTAGE DEVICE)
Bilirubin, UA: NEGATIVE
Blood, UA: NEGATIVE
Glucose, UA: NEGATIVE mg/dL
Ketones, POC UA: NEGATIVE mg/dL
Leukocytes, UA: NEGATIVE
Nitrite, UA: NEGATIVE
Protein Ur, POC: NEGATIVE mg/dL
Specific Gravity, Urine: 1.02
Urobilinogen, Ur: NEGATIVE
pH, UA: 6 (ref 5.0–8.0)

## 2019-04-10 NOTE — Progress Notes (Signed)
Subjective:   HPI  Steven Mullins is a 43 y.o. male who presents for Chief Complaint  Patient presents with  . CPE    fasting CPE     Medical care team includes: Grayland Daisey, Camelia Eng, PA-C here for primary care Dentist Eye doctor  Concerns: No specific concerns  Had gained up to 267lb this past year and got a trainer, end up losing weight  Reviewed their medical, surgical, family, social, medication, and allergy history and updated chart as appropriate.  Past Medical History:  Diagnosis Date  . Allergic rhinitis   . Colon polyp 04/2016  . Erectile dysfunction   . Family history of colon cancer in mother   . Family history of prostate cancer   . Hypertension 2019  . Obesity   . Prostatitis 03/2016    Past Surgical History:  Procedure Laterality Date  . APPENDECTOMY     age 33yo  . COLONOSCOPY  04/2016   serrated polyp, repeat in 2022; Dr. Carol Ada  . WISDOM TOOTH EXTRACTION      Social History   Socioeconomic History  . Marital status: Married    Spouse name: Not on file  . Number of children: Not on file  . Years of education: Not on file  . Highest education level: Not on file  Occupational History  . Not on file  Social Needs  . Financial resource strain: Not on file  . Food insecurity:    Worry: Not on file    Inability: Not on file  . Transportation needs:    Medical: Not on file    Non-medical: Not on file  Tobacco Use  . Smoking status: Never Smoker  . Smokeless tobacco: Never Used  Substance and Sexual Activity  . Alcohol use: Yes    Comment: occasional  . Drug use: No  . Sexual activity: Not on file  Lifestyle  . Physical activity:    Days per week: Not on file    Minutes per session: Not on file  . Stress: Not on file  Relationships  . Social connections:    Talks on phone: Not on file    Gets together: Not on file    Attends religious service: Not on file    Active member of club or organization: Not on file    Attends  meetings of clubs or organizations: Not on file    Relationship status: Not on file  . Intimate partner violence:    Fear of current or ex partner: Not on file    Emotionally abused: Not on file    Physically abused: Not on file    Forced sexual activity: Not on file  Other Topics Concern  . 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  03/2019    Family History  Problem Relation Age of Onset  . Cancer Mother 9       colon  . Hypertension Mother   . Thyroid disease Mother   . Hypertension Father   . Cancer Father 83       died of prostate cancer at 6  . Thyroid disease Brother   . Cancer Maternal Uncle        prostate  . Heart disease Maternal Uncle        MI  . Cancer Maternal Grandfather        prostate  . Cancer Paternal Aunt   .  Diabetes Cousin   . Diabetes Cousin   . Stroke Neg Hx      Current Outpatient Medications:  .  amLODipine (NORVASC) 10 MG tablet, TAKE 1 TABLET BY MOUTH  DAILY, Disp: 90 tablet, Rfl: 0  No Known Allergies  Review of Systems Constitutional: -fever, -chills, -sweats, -unexpected weight change, -decreased appetite, -fatigue Allergy: -sneezing, -itching, -congestion Dermatology: -changing moles, --rash, -lumps ENT: -runny nose, -ear pain, -sore throat, -hoarseness, -sinus pain, -teeth pain, - ringing in ears, -hearing loss, -nosebleeds Cardiology: -chest pain, -palpitations, -swelling, -difficulty breathing when lying flat, -waking up short of breath Respiratory: -cough, -shortness of breath, -difficulty breathing with exercise or exertion, -wheezing, -coughing up blood Gastroenterology: -abdominal pain, -nausea, -vomiting, -diarrhea, -constipation, -blood in stool, -changes in bowel movement, -difficulty swallowing or eating Hematology: -bleeding, -bruising  Musculoskeletal: -joint aches, -muscle aches, -joint swelling, -back pain, -neck pain,  -cramping, -changes in gait Ophthalmology: denies vision changes, eye redness, itching, discharge Urology: -burning with urination, -difficulty urinating, -blood in urine, -urinary frequency, -urgency, -incontinence Neurology: -headache, -weakness, -tingling, -numbness, -memory loss, -falls, -dizziness Psychology: -depressed mood, -agitation, -sleep problems Male GU: no testicular mass, pain, no lymph nodes swollen, no swelling, no rash.     Objective:  BP 126/80   Pulse 80   Temp 98.4 F (36.9 C) (Temporal)   Resp 16   Ht 5\' 7"  (1.702 m)   Wt 256 lb (116.1 kg)   SpO2 98%   BMI 40.10 kg/m    Wt Readings from Last 3 Encounters:  04/10/19 256 lb (116.1 kg)  02/12/18 256 lb 12.8 oz (116.5 kg)  01/10/18 255 lb 9.6 oz (115.9 kg)   BP Readings from Last 3 Encounters:  04/10/19 126/80  02/12/18 118/88  01/10/18 136/86    General appearance: alert, no distress, WD/WN, African American male Skin: no worrisome lesions HEENT: normocephalic, conjunctiva/corneas normal, sclerae anicteric, PERRLA, EOMi, nares patent, no discharge or erythema, pharynx normal Oral cavity: MMM, tongue normal, teeth in good repair Neck: supple, no lymphadenopathy, no thyromegaly, no masses, normal ROM, no bruits Chest: non tender, normal shape and expansion Heart: RRR, normal S1, S2, no murmurs Lungs: CTA bilaterally, no wheezes, rhonchi, or rales Abdomen: +bs, soft, non tender, non distended, no masses, no hepatomegaly, no splenomegaly, no bruits Back: non tender, normal ROM, no scoliosis Musculoskeletal: upper extremities non tender, no obvious deformity, normal ROM throughout, lower extremities non tender, no obvious deformity, normal ROM throughout Extremities: no edema, no cyanosis, no clubbing Pulses: 2+ symmetric, upper and lower extremities, normal cap refill Neurological: alert, oriented x 3, CN2-12 intact, strength normal upper extremities and lower extremities, sensation normal throughout, DTRs  2+ throughout, no cerebellar signs, gait normal Psychiatric: normal affect, behavior normal, pleasant  GU: normal male external genitalia,circumcised, nontender, no masses, no hernia, no lymphadenopathy Rectal: deferred   Assessment and Plan :   Encounter Diagnoses  Name Primary?  . Routine general medical examination at a health care facility Yes  . Essential hypertension, benign   . Allergic rhinitis due to pollen, unspecified seasonality   . Erectile dysfunction, unspecified erectile dysfunction type   . Family history of colon cancer in mother   . Family history of prostate cancer in father   . Family history of thyroid disease   . Obesity, unspecified classification, unspecified obesity type, unspecified whether serious comorbidity present     Physical exam - discussed and counseled on healthy lifestyle, diet, exercise, preventative care, vaccinations, sick and well care, proper use of emergency dept  and after hours care, and addressed their concerns.    Health screening: See your eye doctor yearly for routine vision care. See your dentist yearly for routine dental care including hygiene visits twice yearly.  Cancer screening Advised monthly self testicular exam  Colonoscopy:  Repeat colonoscopy 2022  Discussed PSA, prostate exam, and prostate cancer screening risks/benefits  Plan repeat 2021.     Vaccinations: Advised yearly influenza vaccine  Separate significant chronic issues discussed: C/t current BP medication. Counseled on setting weight loss goals, working towards these goals.   Ziere was seen today for cpe.  Diagnoses and all orders for this visit:  Routine general medical examination at a health care facility -     POCT Urinalysis DIP (Proadvantage Device) -     Comprehensive metabolic panel -     CBC -     Lipid panel  Essential hypertension, benign -     Comprehensive metabolic panel -     Lipid panel  Allergic rhinitis due to pollen,  unspecified seasonality  Erectile dysfunction, unspecified erectile dysfunction type  Family history of colon cancer in mother  Family history of prostate cancer in father  Family history of thyroid disease  Obesity, unspecified classification, unspecified obesity type, unspecified whether serious comorbidity present    Follow-up pending labs, yearly for physical

## 2019-04-11 LAB — LIPID PANEL
Chol/HDL Ratio: 4.1 ratio (ref 0.0–5.0)
Cholesterol, Total: 192 mg/dL (ref 100–199)
HDL: 47 mg/dL (ref >39)
LDL Calculated: 132 mg/dL — ABNORMAL HIGH (ref 0–99)
Triglycerides: 66 mg/dL (ref 0–149)
VLDL Cholesterol Cal: 13 mg/dL (ref 5–40)

## 2019-04-11 LAB — COMPREHENSIVE METABOLIC PANEL
ALT: 22 IU/L (ref 0–44)
AST: 18 IU/L (ref 0–40)
Albumin/Globulin Ratio: 1.8 (ref 1.2–2.2)
Albumin: 4.5 g/dL (ref 4.0–5.0)
Alkaline Phosphatase: 82 IU/L (ref 39–117)
BUN/Creatinine Ratio: 16 (ref 9–20)
BUN: 14 mg/dL (ref 6–24)
Bilirubin Total: 0.8 mg/dL (ref 0.0–1.2)
CO2: 24 mmol/L (ref 20–29)
Calcium: 9.1 mg/dL (ref 8.7–10.2)
Chloride: 99 mmol/L (ref 96–106)
Creatinine, Ser: 0.9 mg/dL (ref 0.76–1.27)
GFR calc Af Amer: 120 mL/min/{1.73_m2} (ref >59)
GFR calc non Af Amer: 104 mL/min/{1.73_m2} (ref >59)
Globulin, Total: 2.5 g/dL (ref 1.5–4.5)
Glucose: 84 mg/dL (ref 65–99)
Potassium: 4.4 mmol/L (ref 3.5–5.2)
Sodium: 140 mmol/L (ref 134–144)
Total Protein: 7 g/dL (ref 6.0–8.5)

## 2019-04-11 LAB — CBC
Hematocrit: 44.6 % (ref 37.5–51.0)
Hemoglobin: 15 g/dL (ref 13.0–17.7)
MCH: 29.6 pg (ref 26.6–33.0)
MCHC: 33.6 g/dL (ref 31.5–35.7)
MCV: 88 fL (ref 79–97)
Platelets: 281 10*3/uL (ref 150–450)
RBC: 5.06 x10E6/uL (ref 4.14–5.80)
RDW: 12.8 % (ref 11.6–15.4)
WBC: 6 10*3/uL (ref 3.4–10.8)

## 2019-05-18 ENCOUNTER — Other Ambulatory Visit: Payer: Self-pay | Admitting: Medical

## 2019-08-11 ENCOUNTER — Other Ambulatory Visit: Payer: Self-pay | Admitting: Medical

## 2019-08-11 MED ORDER — SILDENAFIL CITRATE 100 MG PO TABS: ORAL_TABLET | ORAL | 0 refills | Status: DC

## 2019-09-05 ENCOUNTER — Other Ambulatory Visit: Payer: Self-pay | Admitting: Medical

## 2019-11-25 ENCOUNTER — Other Ambulatory Visit: Payer: Self-pay | Admitting: Medical

## 2020-02-14 ENCOUNTER — Other Ambulatory Visit: Payer: Self-pay | Admitting: Medical

## 2020-05-05 ENCOUNTER — Other Ambulatory Visit: Payer: Self-pay | Admitting: Medical

## 2020-05-06 NOTE — Telephone Encounter (Signed)
Lmom for patient to call and schedule an appointment for further refills on his medications.

## 2020-05-25 ENCOUNTER — Telehealth: Payer: Self-pay

## 2020-05-25 NOTE — Telephone Encounter (Signed)
This request is denied

## 2020-05-25 NOTE — Telephone Encounter (Signed)
Received fax from [facility] for a refill on the pts. Amlodipine pt. Last apt was 04/10/19 and has no future apt.

## 2020-11-29 ENCOUNTER — Ambulatory Visit (INDEPENDENT_AMBULATORY_CARE_PROVIDER_SITE_OTHER): Payer: No Typology Code available for payment source | Admitting: Medical

## 2020-11-29 ENCOUNTER — Encounter: Payer: Self-pay | Admitting: Medical

## 2020-11-29 ENCOUNTER — Other Ambulatory Visit: Payer: Self-pay

## 2020-11-29 VITALS — BP 120/79 | HR 79 | Ht 67.0 in | Wt 280.6 lb

## 2020-11-29 DIAGNOSIS — I10 Essential (primary) hypertension: Secondary | ICD-10-CM

## 2020-11-29 DIAGNOSIS — Z23 Encounter for immunization: Secondary | ICD-10-CM

## 2020-11-29 DIAGNOSIS — Z6841 Body Mass Index (BMI) 40.0 and over, adult: Secondary | ICD-10-CM

## 2020-11-29 MED ORDER — AMLODIPINE BESYLATE 10 MG PO TABS: 10.0000 mg | ORAL_TABLET | Freq: Every day | ORAL | 0 refills | Status: DC

## 2020-11-29 NOTE — Progress Notes (Signed)
Subjective:  Steven Mullins is a 45 y.o. male who presents for Chief Complaint  Patient presents with  . Medication Refill     Here for med check on blood pressure medicine.  Last visit 2020.  Somehow he inadvertently didn't come in last year for physical.  He is doing fine, no particular symptoms.  No chest pain or palpitations, no edema.  He has gained weight being at home during [place].  He works from home for the last 4 years but thinks he does got slack with exercise.  Otherwise normal state of health  He would like his COVID booster and flu shot.  He had his COVID-vaccine back in the spring 2021  He is due for repeat colonoscopy this year.  Family history of colon cancer in mother in her 47s.  No other aggravating or relieving factors.    No other c/o.  Past Medical History:  Diagnosis Date  . Allergic rhinitis   . Colon polyp 04/2016  . Erectile dysfunction   . Family history of colon cancer in mother   . Family history of prostate cancer   . Hypertension 2019  . Obesity   . Prostatitis 03/2016     The following portions of the patient's history were reviewed and updated as appropriate: allergies, current medications, past family history, past medical history, past social history, past surgical history and problem list.  ROS Otherwise as in subjective above    Objective: BP 120/79   Pulse 79   Ht 5\' 7"  (1.702 m)   Wt 280 lb 9.6 oz (127.3 kg)   SpO2 97%   BMI 43.95 kg/m   General appearance: alert, no distress, well developed, well nourished Neck: supple, no lymphadenopathy, no thyromegaly, no masses Heart: RRR, normal S1, S2, no murmurs Lungs: CTA bilaterally, no wheezes, rhonchi, or rales Pulses: 2+ radial pulses, 2+ pedal pulses, normal cap refill Ext: no edema   Assessment: Encounter Diagnoses  Name Primary?  . Essential hypertension, benign Yes  . BMI 40.0-44.9, adult ([place])   . Need for influenza vaccination   . Need for COVID-19 vaccine       Plan: Hypertension-continue current medication, follow-up in 2 to 3 months for fasting physical  Counseled on the influenza virus vaccine.  Vaccine information sheet given.  Influenza vaccine given after consent obtained.  Counseled on [employer] Covid virus vaccine booster.  Vaccine information sheet given.  Covid vaccine given after consent obtained.  Obesity - work on efforts to lose weigh as discussed.  Gained over 25lb this past year!   Steven Mullins was seen today for medication refill.  Diagnoses and all orders for this visit:  Essential hypertension, benign  BMI 40.0-44.9, adult ([place])  Need for influenza vaccination  Need for COVID-19 vaccine -     Pfizer SARS-COV-2 Vaccine  Other orders -     amLODipine (NORVASC) 10 MG tablet; Take 1 tablet (10 mg total) by mouth daily. -     Flu Vaccine QUAD 6+ mos PF IM (Fluarix Quad PF)    Follow up: 2 to 3 months for fasting physical

## 2021-01-21 ENCOUNTER — Other Ambulatory Visit: Payer: Self-pay | Admitting: Medical

## 2021-01-25 ENCOUNTER — Encounter: Payer: Self-pay | Admitting: Medical

## 2021-01-25 ENCOUNTER — Other Ambulatory Visit: Payer: Self-pay

## 2021-01-25 ENCOUNTER — Ambulatory Visit (INDEPENDENT_AMBULATORY_CARE_PROVIDER_SITE_OTHER): Payer: No Typology Code available for payment source | Admitting: Medical

## 2021-01-25 VITALS — BP 126/85 | HR 63 | Ht 67.0 in | Wt 271.4 lb

## 2021-01-25 DIAGNOSIS — Z13220 Encounter for screening for lipoid disorders: Secondary | ICD-10-CM

## 2021-01-25 DIAGNOSIS — I10 Essential (primary) hypertension: Secondary | ICD-10-CM

## 2021-01-25 DIAGNOSIS — Z800 Family history of malignant neoplasm of digestive organs: Secondary | ICD-10-CM

## 2021-01-25 DIAGNOSIS — J301 Allergic rhinitis due to pollen: Secondary | ICD-10-CM | POA: Diagnosis not present

## 2021-01-25 DIAGNOSIS — Z0000 Encounter for general adult medical examination without abnormal findings: Secondary | ICD-10-CM | POA: Diagnosis not present

## 2021-01-25 DIAGNOSIS — Z8349 Family history of other endocrine, nutritional and metabolic diseases: Secondary | ICD-10-CM

## 2021-01-25 DIAGNOSIS — Z8042 Family history of malignant neoplasm of prostate: Secondary | ICD-10-CM | POA: Diagnosis not present

## 2021-01-25 DIAGNOSIS — Z125 Encounter for screening for malignant neoplasm of prostate: Secondary | ICD-10-CM

## 2021-01-25 DIAGNOSIS — Z6841 Body Mass Index (BMI) 40.0 and over, adult: Secondary | ICD-10-CM

## 2021-01-25 NOTE — Progress Notes (Signed)
Subjective:   HPI  Steven Mullins is a 45 y.o. male who presents for Chief Complaint  Patient presents with  . Annual Exam    Physical with fasting labs     Patient Care Team: Jadarrius Maselli, Leward Quan as PCP - General (Family Medicine) Sees dentist Sees eye doctor Dr. Carol Ada, GI   Concerns: none  Reviewed their medical, surgical, family, social, medication, and allergy history and updated chart as appropriate.  Past Medical History:  Diagnosis Date  . Allergic rhinitis   . Colon polyp 04/2016  . Erectile dysfunction   . Family history of colon cancer in mother   . Family history of prostate cancer   . Hypertension 2019  . Obesity   . Prostatitis 03/2016    Past Surgical History:  Procedure Laterality Date  . APPENDECTOMY     age 29yo  . COLONOSCOPY  04/2016   serrated polyp, repeat in 2022; Dr. Carol Ada  . WISDOM TOOTH EXTRACTION      Family History  Problem Relation Age of Onset  . Cancer Mother 9       colon  . Hypertension Mother   . Thyroid disease Mother   . Hypertension Father   . Cancer Father 76       died of prostate cancer at 45  . Thyroid disease Brother   . Cancer Maternal Uncle        prostate  . Heart disease Maternal Uncle        MI  . Cancer Maternal Grandfather        prostate  . Cancer Paternal Aunt   . Diabetes Cousin   . Diabetes Cousin   . Stroke Neg Hx      Current Outpatient Medications:  .  amLODipine (NORVASC) 10 MG tablet, TAKE 1 TABLET BY MOUTH  DAILY, Disp: 90 tablet, Rfl: 0  No Known Allergies     Review of Systems Constitutional: -fever, -chills, -sweats, -unexpected weight change, -decreased appetite, -fatigue Allergy: -sneezing, -itching, -congestion Dermatology: -changing moles, --rash, -lumps ENT: -runny nose, -ear pain, -sore throat, -hoarseness, -sinus pain, -teeth pain, - ringing in ears, -hearing loss, -nosebleeds Cardiology: -chest pain, -palpitations, -swelling, -difficulty breathing  when lying flat, -waking up short of breath Respiratory: -cough, -shortness of breath, -difficulty breathing with exercise or exertion, -wheezing, -coughing up blood Gastroenterology: -abdominal pain, -nausea, -vomiting, -diarrhea, -constipation, -blood in stool, -changes in bowel movement, -difficulty swallowing or eating Hematology: -bleeding, -bruising  Musculoskeletal: -joint aches, -muscle aches, -joint swelling, -back pain, -neck pain, -cramping, -changes in gait Ophthalmology: denies vision changes, eye redness, itching, discharge Urology: -burning with urination, -difficulty urinating, -blood in urine, -urinary frequency, -urgency, -incontinence Neurology: -headache, -weakness, -tingling, -numbness, -memory loss, -falls, -dizziness Psychology: -depressed mood, -agitation, -sleep problems Male GU: no testicular mass, pain, no lymph nodes swollen, no swelling, no rash.     Objective:  BP 126/85   Pulse 63   Ht 5\' 7"  (1.702 m)   Wt 271 lb 6.4 oz (123.1 kg)   SpO2 97%   BMI 42.51 kg/m   General appearance: alert, no distress, WD/WN, African American male Skin: unremarkable, no worrisome lesions HEENT: normocephalic, conjunctiva/corneas normal, sclerae anicteric, PERRLA, EOMi, nares patent, no discharge or erythema, pharynx normal Oral cavity: MMM, tongue normal, teeth normal Neck: supple, no lymphadenopathy, no thyromegaly, no masses, normal ROM, no bruits Chest: non tender, normal shape and expansion Heart: RRR, normal S1, S2, no murmurs Lungs: CTA bilaterally, no wheezes, rhonchi, or rales  Abdomen: +bs, soft, non tender, non distended, no masses, no hepatomegaly, no splenomegaly, no bruits Back: non tender, normal ROM, no scoliosis Musculoskeletal: upper extremities non tender, no obvious deformity, normal ROM throughout, lower extremities non tender, no obvious deformity, normal ROM throughout Extremities: no edema, no cyanosis, no clubbing Pulses: 2+ symmetric, upper and  lower extremities, normal cap refill Neurological: alert, oriented x 3, CN2-12 intact, strength normal upper extremities and lower extremities, sensation normal throughout, DTRs 2+ throughout, no cerebellar signs, gait normal Psychiatric: normal affect, behavior normal, pleasant  GU: normal male external genitalia,circumcised, nontender, no masses, no hernia, no lymphadenopathy Rectal: anus normal tone, prostate WNL   Assessment and Plan :   Encounter Diagnoses  Name Primary?  . Routine general medical examination at a health care facility Yes  . Allergic rhinitis due to pollen, unspecified seasonality   . Family history of thyroid disease   . Family history of prostate cancer in father   . Family history of colon cancer in mother   . Essential hypertension, benign   . BMI 40.0-44.9, adult ([place])   . Screening for lipid disorders   . Screening for prostate cancer     Today you had a preventative care visit or wellness visit.    Topics today may have included healthy lifestyle, diet, exercise, preventative care, vaccinations, sick and well care, proper use of emergency dept and after hours care, as well as other concerns.     Recommendations: Continue to return yearly for your annual wellness and preventative care visits.  This gives [place] a chance to discuss healthy lifestyle, exercise, vaccinations, review your chart record, and perform screenings where appropriate.  I recommend you see your eye doctor yearly for routine vision care.  I recommend you see your dentist yearly for routine dental care including hygiene visits twice yearly.   Vaccination recommendations were reviewed You are up to date on tetanus booster You are up to date on covid vaccine You are up to date on flu vaccine   Screening for cancer: Colon cancer screening:  Call Dr. Ulyses Amor office for follow up Reviewed last colonoscopy from 2017  Cancer screening You should do a monthly self testicular exam   We  discussed PSA, prostate exam, and prostate cancer screening risks/benefits.     Skin cancer screening: Check your skin regularly for new changes, growing lesions, or other lesions of concern Come in for evaluation if you have skin lesions of concern.  Lung cancer screening: If you have a greater than 30 pack year history of tobacco use, then you qualify for lung cancer screening with a chest CT scan  We currently don't have screenings for other cancers besides breast, cervical, colon, and lung cancers.  If you have a strong family history of cancer or have other cancer screening concerns, please let me know.    Bone health: Get at least 150 minutes of aerobic exercise weekly Get weight bearing exercise at least once weekly   Heart health: Get at least 150 minutes of aerobic exercise weekly Limit alcohol It is important to maintain a healthy blood pressure and healthy cholesterol numbers  Consider other screening such as CT Calcium score, echo, cardiology consult    Separate significant issues discussed: HTN - c/t current medicaiton  BMI>42 - c/t efforts to lose weight, congratulated him on recent weight loss   Ponciano was seen today for annual exam.  Diagnoses and all orders for this visit:  Routine general medical examination at a health  care facility -     Comprehensive metabolic panel -     CBC with Differential/Platelet -     Lipid panel -     PSA -     TSH  Allergic rhinitis due to pollen, unspecified seasonality  Family history of thyroid disease -     TSH  Family history of prostate cancer in father -     PSA  Family history of colon cancer in mother  Essential hypertension, benign -     Comprehensive metabolic panel  BMI 69.5-07.2, adult ([place]) -     Comprehensive metabolic panel  Screening for lipid disorders -     Lipid panel  Screening for prostate cancer -     PSA     Follow-up pending labs, yearly for physical

## 2021-01-26 ENCOUNTER — Other Ambulatory Visit: Payer: Self-pay | Admitting: Medical

## 2021-01-26 LAB — COMPREHENSIVE METABOLIC PANEL
ALT: 18 IU/L (ref 0–44)
AST: 25 IU/L (ref 0–40)
Albumin/Globulin Ratio: 1.9 (ref 1.2–2.2)
Albumin: 4.5 g/dL (ref 4.0–5.0)
Alkaline Phosphatase: 82 IU/L (ref 44–121)
BUN/Creatinine Ratio: 15 (ref 9–20)
BUN: 14 mg/dL (ref 6–24)
Bilirubin Total: 0.9 mg/dL (ref 0.0–1.2)
CO2: 24 mmol/L (ref 20–29)
Calcium: 9.2 mg/dL (ref 8.7–10.2)
Chloride: 101 mmol/L (ref 96–106)
Creatinine, Ser: 0.93 mg/dL (ref 0.76–1.27)
Globulin, Total: 2.4 g/dL (ref 1.5–4.5)
Glucose: 83 mg/dL (ref 65–99)
Potassium: 4.3 mmol/L (ref 3.5–5.2)
Sodium: 140 mmol/L (ref 134–144)
Total Protein: 6.9 g/dL (ref 6.0–8.5)
eGFR: 103 mL/min/{1.73_m2} (ref >59)

## 2021-01-26 LAB — CBC WITH DIFFERENTIAL/PLATELET
Basophils Absolute: 0 10*3/uL (ref 0.0–0.2)
Basos: 0 %
EOS (ABSOLUTE): 0.3 10*3/uL (ref 0.0–0.4)
Eos: 4 %
Hematocrit: 46.1 % (ref 37.5–51.0)
Hemoglobin: 15.4 g/dL (ref 13.0–17.7)
Immature Grans (Abs): 0 10*3/uL (ref 0.0–0.1)
Immature Granulocytes: 0 %
Lymphocytes Absolute: 2.2 10*3/uL (ref 0.7–3.1)
Lymphs: 40 %
MCH: 30.1 pg (ref 26.6–33.0)
MCHC: 33.4 g/dL (ref 31.5–35.7)
MCV: 90 fL (ref 79–97)
Monocytes Absolute: 0.5 10*3/uL (ref 0.1–0.9)
Monocytes: 10 %
Neutrophils Absolute: 2.6 10*3/uL (ref 1.4–7.0)
Neutrophils: 46 %
Platelets: 245 10*3/uL (ref 150–450)
RBC: 5.11 x10E6/uL (ref 4.14–5.80)
RDW: 12.8 % (ref 11.6–15.4)
WBC: 5.6 10*3/uL (ref 3.4–10.8)

## 2021-01-26 LAB — LIPID PANEL
Chol/HDL Ratio: 3.8 ratio (ref 0.0–5.0)
Cholesterol, Total: 207 mg/dL — ABNORMAL HIGH (ref 100–199)
HDL: 55 mg/dL (ref >39)
LDL Chol Calc (NIH): 142 mg/dL — ABNORMAL HIGH (ref 0–99)
Triglycerides: 58 mg/dL (ref 0–149)
VLDL Cholesterol Cal: 10 mg/dL (ref 5–40)

## 2021-01-26 LAB — PSA: Prostate Specific Ag, Serum: 0.2 ng/mL (ref 0.0–4.0)

## 2021-01-26 LAB — TSH: TSH: 1.14 u[IU]/mL (ref 0.450–4.500)

## 2021-04-18 ENCOUNTER — Other Ambulatory Visit: Payer: Self-pay

## 2021-04-18 MED ORDER — AMLODIPINE BESYLATE 10 MG PO TABS: 1.0000 | ORAL_TABLET | Freq: Every day | ORAL | 1 refills | Status: DC

## 2021-10-03 ENCOUNTER — Other Ambulatory Visit: Payer: Self-pay | Admitting: Medical

## 2022-03-11 ENCOUNTER — Other Ambulatory Visit: Payer: Self-pay | Admitting: Medical

## 2022-03-12 NOTE — Telephone Encounter (Signed)
Pt has upcoming appt next week.  ?

## 2022-04-11 ENCOUNTER — Ambulatory Visit (INDEPENDENT_AMBULATORY_CARE_PROVIDER_SITE_OTHER): Payer: No Typology Code available for payment source | Admitting: Medical

## 2022-04-11 VITALS — BP 122/80 | HR 82 | Ht 67.0 in | Wt 284.6 lb

## 2022-04-11 DIAGNOSIS — Z800 Family history of malignant neoplasm of digestive organs: Secondary | ICD-10-CM

## 2022-04-11 DIAGNOSIS — Z6841 Body Mass Index (BMI) 40.0 and over, adult: Secondary | ICD-10-CM | POA: Diagnosis not present

## 2022-04-11 DIAGNOSIS — Z0000 Encounter for general adult medical examination without abnormal findings: Secondary | ICD-10-CM | POA: Diagnosis not present

## 2022-04-11 DIAGNOSIS — J301 Allergic rhinitis due to pollen: Secondary | ICD-10-CM

## 2022-04-11 DIAGNOSIS — I10 Essential (primary) hypertension: Secondary | ICD-10-CM | POA: Diagnosis not present

## 2022-04-11 DIAGNOSIS — Z13220 Encounter for screening for lipoid disorders: Secondary | ICD-10-CM

## 2022-04-11 DIAGNOSIS — Z8042 Family history of malignant neoplasm of prostate: Secondary | ICD-10-CM

## 2022-04-11 DIAGNOSIS — Z125 Encounter for screening for malignant neoplasm of prostate: Secondary | ICD-10-CM

## 2022-04-11 DIAGNOSIS — Z131 Encounter for screening for diabetes mellitus: Secondary | ICD-10-CM

## 2022-04-11 MED ORDER — AMLODIPINE BESYLATE 10 MG PO TABS: 10.0000 mg | ORAL_TABLET | Freq: Every day | ORAL | 1 refills | Status: DC

## 2022-04-11 NOTE — Progress Notes (Signed)
Subjective:   HPI  Steven Mullins is a 46 y.o. male who presents for Chief Complaint  Patient presents with   fasting cpe    Fasting cpe, no concerns    Patient Care Team: Jocelyne Reinertsen, Leward Quan as PCP - General (Family Medicine) Sees dentist Sees eye doctor Dr. Carol Ada, GI  Concerns: None, compliant with BP  medication  Reviewed their medical, surgical, family, social, medication, and allergy history and updated chart as appropriate.  Past Medical History:  Diagnosis Date   Allergic rhinitis    Colon polyp 04/2016   Erectile dysfunction    Family history of colon cancer in mother    Family history of prostate cancer    Hypertension 2019   Obesity    Prostatitis 03/2016    Past Surgical History:  Procedure Laterality Date   APPENDECTOMY     age 45yo   COLONOSCOPY  04/2016   serrated polyp, repeat in 2022; Dr. Carol Ada   WISDOM TOOTH EXTRACTION      Family History  Problem Relation Age of Onset   Cancer Mother 65       colon   Hypertension Mother    Thyroid disease Mother    Hypertension Father    Cancer Father 61       died of prostate cancer at 84   Thyroid disease Brother    Cancer Maternal Uncle        prostate   Heart disease Maternal Uncle        MI   Cancer Maternal Grandfather        prostate   Cancer Paternal Aunt    Diabetes Cousin    Diabetes Cousin    Stroke Neg Hx      Current Outpatient Medications:    amLODipine (NORVASC) 10 MG tablet, TAKE 1 TABLET BY MOUTH  DAILY, Disp: 90 tablet, Rfl: 1  No Known Allergies   Review of Systems Constitutional: -fever, -chills, -sweats, -unexpected weight change, -decreased appetite, -fatigue Allergy: -sneezing, -itching, -congestion Dermatology: -changing moles, --rash, -lumps ENT: -runny nose, -ear pain, -sore throat, -hoarseness, -sinus pain, -teeth pain, - ringing in ears, -hearing loss, -nosebleeds Cardiology: -chest pain, -palpitations, -swelling, -difficulty breathing when  lying flat, -waking up short of breath Respiratory: -cough, -shortness of breath, -difficulty breathing with exercise or exertion, -wheezing, -coughing up blood Gastroenterology: -abdominal pain, -nausea, -vomiting, -diarrhea, -constipation, -blood in stool, -changes in bowel movement, -difficulty swallowing or eating Hematology: -bleeding, -bruising  Musculoskeletal: -joint aches, -muscle aches, -joint swelling, -back pain, -neck pain, -cramping, -changes in gait Ophthalmology: denies vision changes, eye redness, itching, discharge Urology: -burning with urination, -difficulty urinating, -blood in urine, -urinary frequency, -urgency, -incontinence Neurology: -headache, -weakness, -tingling, -numbness, -memory loss, -falls, -dizziness Psychology: -depressed mood, -agitation, -sleep problems Male GU: no testicular mass, pain, no lymph nodes swollen, no swelling, no rash.     04/11/2022    8:25 AM 01/25/2021    8:20 AM 11/29/2020    1:01 PM 04/10/2019    8:28 AM 01/10/2018    8:07 AM  Depression screen PHQ 2/9  Decreased Interest 0 0 0 0 0  Down, Depressed, Hopeless 0 0 0 0 0  PHQ - 2 Score 0 0 0 0 0        Objective:  BP 122/80   Pulse 82   Ht '5\' 7"'$  (1.702 m)   Wt 284 lb 9.6 oz (129.1 kg)   BMI 44.57 kg/m   General appearance: alert, no distress,  WD/WN, African American male Skin: unremarkable HEENT: normocephalic, conjunctiva/corneas normal, sclerae anicteric, PERRLA, EOMi Neck: supple, no lymphadenopathy, no thyromegaly, no masses, normal ROM, no bruits Chest: non tender, normal shape and expansion Heart: RRR, normal S1, S2, no murmurs Lungs: CTA bilaterally, no wheezes, rhonchi, or rales Abdomen: +bs, soft, non tender, non distended, no masses, no hepatomegaly, no splenomegaly, no bruits Back: non tender, normal ROM, no scoliosis Musculoskeletal: upper extremities non tender, no obvious deformity, normal ROM throughout, lower extremities non tender, no obvious deformity, normal  ROM throughout Extremities: no edema, no cyanosis, no clubbing Pulses: 2+ symmetric, upper and lower extremities, normal cap refill Neurological: alert, oriented x 3, CN2-12 intact, strength normal upper extremities and lower extremities, sensation normal throughout, DTRs 2+ throughout, no cerebellar signs, gait normal Psychiatric: normal affect, behavior normal, pleasant  GU: normal male external genitalia,circumcised, nontender, no masses, no hernia, no lymphadenopathy Rectal: anus normal tone, prostate WNL   Assessment and Plan :   Encounter Diagnoses  Name Primary?   Routine general medical examination at a health care facility Yes   BMI 40.0-44.9, adult [facility])    Essential hypertension, benign    Family history of colon cancer in mother    Family history of prostate cancer in father    Screening for lipid disorders    Screening for prostate cancer    Allergic rhinitis due to pollen, unspecified seasonality    Screening for diabetes mellitus     This visit was a preventative care visit, also known as wellness visit or routine physical.   Topics typically include healthy lifestyle, diet, exercise, preventative care, vaccinations, sick and well care, proper use of emergency dept and after hours care, as well as other concerns.     Recommendations: Continue to return yearly for your annual wellness and preventative care visits.  This gives [place] a chance to discuss healthy lifestyle, exercise, vaccinations, review your chart record, and perform screenings where appropriate.  I recommend you see your eye doctor yearly for routine vision care.  I recommend you see your dentist yearly for routine dental care including hygiene visits twice yearly.   Vaccination recommendations were reviewed Immunization History  Administered Date(s) Administered   Influenza,inj,Quad PF,6+ Mos 08/08/2018, 11/29/2020   Influenza-Unspecified 08/11/2017, 08/11/2017   PFIZER(Purple Top)SARS-COV-2  Vaccination 02/06/2020, 02/27/2020, 11/29/2020   Tdap 04/10/2013     Screening for cancer: Colon cancer screening: Due now for repeat colonoscopy.   Last one was 2017.  He is reaching out to Dr. Ulyses Amor office to schedule.    We discussed PSA, prostate exam, and prostate cancer screening risks/benefits.     Skin cancer screening: Check your skin regularly for new changes, growing lesions, or other lesions of concern Come in for evaluation if you have skin lesions of concern.  Lung cancer screening: If you have a greater than 20 pack year history of tobacco use, then you may qualify for lung cancer screening with a chest CT scan.   Please call your insurance company to inquire about coverage for this test.  We currently don't have screenings for other cancers besides breast, cervical, colon, and lung cancers.  If you have a strong family history of cancer or have other cancer screening concerns, please let me know.    Bone health: Get at least 150 minutes of aerobic exercise weekly Get weight bearing exercise at least once weekly Bone density test:  A bone density test is an imaging test that uses a type of X-ray to measure  the amount of calcium and other minerals in your bones. The test may be used to diagnose or screen you for a condition that causes weak or thin bones (osteoporosis), predict your risk for a broken bone (fracture), or determine how well your osteoporosis treatment is working. The bone density test is recommended for females 72 and older, or females or males <47 if certain risk factors such as thyroid disease, long term use of steroids such as for asthma or rheumatological issues, vitamin D deficiency, estrogen deficiency, family history of osteoporosis, self or family history of fragility fracture in first degree relative.    Heart health: Get at least 150 minutes of aerobic exercise weekly Limit alcohol It is important to maintain a healthy blood pressure and  healthy cholesterol numbers  Heart disease screening: Screening for heart disease includes screening for blood pressure, fasting lipids, glucose/diabetes screening, BMI height to weight ratio, reviewed of smoking status, physical activity, and diet.    Goals include blood pressure 120/80 or less, maintaining a healthy lipid/cholesterol profile, preventing diabetes or keeping diabetes numbers under good control, not smoking or using tobacco products, exercising most days per week or at least 150 minutes per week of exercise, and eating healthy variety of fruits and vegetables, healthy oils, and avoiding unhealthy food choices like fried food, fast food, high sugar and high cholesterol foods.    Other tests may possibly include EKG test, CT coronary calcium score, echocardiogram, exercise treadmill stress test.   Consider CT coronary calcium test.  Let me know if agreeable.   Medical care options: I recommend you continue to seek care here first for routine care.  We try really hard to have available appointments Monday through Friday daytime hours for sick visits, acute visits, and physicals.  Urgent care should be used for after hours and weekends for significant issues that cannot wait till the next day.  The emergency department should be used for significant potentially life-threatening emergencies.  The emergency department is expensive, can often have long wait times for less significant concerns, so try to utilize primary care, urgent care, or telemedicine when possible to avoid unnecessary trips to the emergency department.  Virtual visits and telemedicine have been introduced since the pandemic started in 2020, and can be convenient ways to receive medical care.  We offer virtual appointments as well to assist you in a variety of options to seek medical care.   Advanced Directives: I recommend you consider completing a [place] and Living Will.   These documents respect  your wishes and help alleviate burdens on your loved ones if you were to become terminally ill or be in a position to need those documents enforced.    You can complete Advanced Directives yourself, have them notarized, then have copies made for our office, for you and for anybody you feel should have them in safe keeping.  Or, you can have an attorney prepare these documents.   If you haven't updated your Last Will and Testament in a while, it may be worthwhile having an attorney prepare these documents together and save on some costs.       Separate significant issues discussed: Hypertension - continue current medication  BMI > 44 - work on efforts to lose weight through health diet and exercise     Jakarie was seen today for fasting cpe.  Diagnoses and all orders for this visit:  Routine general medical examination at a health care facility -  Comprehensive metabolic panel -     CBC -     Lipid panel -     PSA -     Hemoglobin A1c -     Urinalysis  BMI 40.0-44.9, adult ([facility])  Essential hypertension, benign  Family history of colon cancer in mother  Family history of prostate cancer in father -     PSA  Screening for lipid disorders -     Lipid panel  Screening for prostate cancer -     PSA  Allergic rhinitis due to pollen, unspecified seasonality  Screening for diabetes mellitus -     Hemoglobin A1c     Follow-up pending labs, yearly for physical

## 2022-04-11 NOTE — Patient Instructions (Signed)
This visit was a preventative care visit, also known as wellness visit or routine physical.   Topics typically include healthy lifestyle, diet, exercise, preventative care, vaccinations, sick and well care, proper use of emergency dept and after hours care, as well as other concerns.     Recommendations: Continue to return yearly for your annual wellness and preventative care visits.  This gives [place] a chance to discuss healthy lifestyle, exercise, vaccinations, review your chart record, and perform screenings where appropriate.  I recommend you see your eye doctor yearly for routine vision care.  I recommend you see your dentist yearly for routine dental care including hygiene visits twice yearly.   Vaccination recommendations were reviewed Immunization History  Administered Date(s) Administered   Influenza,inj,Quad PF,6+ Mos 08/08/2018, 11/29/2020   Influenza-Unspecified 08/11/2017, 08/11/2017   PFIZER(Purple Top)SARS-COV-2 Vaccination 02/06/2020, 02/27/2020, 11/29/2020   Tdap 04/10/2013     Screening for cancer: Colon cancer screening: Due now for repeat colonoscopy.   Last one was 2017.  He is reaching out to Dr. Ulyses Amor office to schedule.    We discussed PSA, prostate exam, and prostate cancer screening risks/benefits.     Skin cancer screening: Check your skin regularly for new changes, growing lesions, or other lesions of concern Come in for evaluation if you have skin lesions of concern.  Lung cancer screening: If you have a greater than 20 pack year history of tobacco use, then you may qualify for lung cancer screening with a chest CT scan.   Please call your insurance company to inquire about coverage for this test.  We currently don't have screenings for other cancers besides breast, cervical, colon, and lung cancers.  If you have a strong family history of cancer or have other cancer screening concerns, please let me know.    Bone health: Get at least 150 minutes of  aerobic exercise weekly Get weight bearing exercise at least once weekly Bone density test:  A bone density test is an imaging test that uses a type of X-ray to measure the amount of calcium and other minerals in your bones. The test may be used to diagnose or screen you for a condition that causes weak or thin bones (osteoporosis), predict your risk for a broken bone (fracture), or determine how well your osteoporosis treatment is working. The bone density test is recommended for females 61 and older, or females or males <38 if certain risk factors such as thyroid disease, long term use of steroids such as for asthma or rheumatological issues, vitamin D deficiency, estrogen deficiency, family history of osteoporosis, self or family history of fragility fracture in first degree relative.    Heart health: Get at least 150 minutes of aerobic exercise weekly Limit alcohol It is important to maintain a healthy blood pressure and healthy cholesterol numbers  Heart disease screening: Screening for heart disease includes screening for blood pressure, fasting lipids, glucose/diabetes screening, BMI height to weight ratio, reviewed of smoking status, physical activity, and diet.    Goals include blood pressure 120/80 or less, maintaining a healthy lipid/cholesterol profile, preventing diabetes or keeping diabetes numbers under good control, not smoking or using tobacco products, exercising most days per week or at least 150 minutes per week of exercise, and eating healthy variety of fruits and vegetables, healthy oils, and avoiding unhealthy food choices like fried food, fast food, high sugar and high cholesterol foods.    Other tests may possibly include EKG test, CT coronary calcium score, echocardiogram, exercise treadmill stress test.  Consider CT coronary calcium test.  Let me know if agreeable.   Medical care options: I recommend you continue to seek care here first for routine care.  We try  really hard to have available appointments Monday through Friday daytime hours for sick visits, acute visits, and physicals.  Urgent care should be used for after hours and weekends for significant issues that cannot wait till the next day.  The emergency department should be used for significant potentially life-threatening emergencies.  The emergency department is expensive, can often have long wait times for less significant concerns, so try to utilize primary care, urgent care, or telemedicine when possible to avoid unnecessary trips to the emergency department.  Virtual visits and telemedicine have been introduced since the pandemic started in 2020, and can be convenient ways to receive medical care.  We offer virtual appointments as well to assist you in a variety of options to seek medical care.   Advanced Directives: I recommend you consider completing a [place] and Living Will.   These documents respect your wishes and help alleviate burdens on your loved ones if you were to become terminally ill or be in a position to need those documents enforced.    You can complete Advanced Directives yourself, have them notarized, then have copies made for our office, for you and for anybody you feel should have them in safe keeping.  Or, you can have an attorney prepare these documents.   If you haven't updated your Last Will and Testament in a while, it may be worthwhile having an attorney prepare these documents together and save on some costs.       Separate significant issues discussed: Hypertension - continue current medication  BMI > 44 - work on efforts to lose weight through health diet and exercise

## 2022-04-12 LAB — URINALYSIS
Bilirubin, UA: NEGATIVE
Glucose, UA: NEGATIVE
Ketones, UA: NEGATIVE
Leukocytes,UA: NEGATIVE
Nitrite, UA: NEGATIVE
Protein,UA: NEGATIVE
RBC, UA: NEGATIVE
Specific Gravity, UA: 1.017 (ref 1.005–1.030)
Urobilinogen, Ur: 0.2 mg/dL (ref 0.2–1.0)
pH, UA: 7.5 (ref 5.0–7.5)

## 2022-04-12 LAB — COMPREHENSIVE METABOLIC PANEL
ALT: 22 IU/L (ref 0–44)
AST: 16 IU/L (ref 0–40)
Albumin/Globulin Ratio: 1.5 (ref 1.2–2.2)
Albumin: 4.3 g/dL (ref 4.0–5.0)
Alkaline Phosphatase: 105 IU/L (ref 44–121)
BUN/Creatinine Ratio: 11 (ref 9–20)
BUN: 9 mg/dL (ref 6–24)
Bilirubin Total: 0.7 mg/dL (ref 0.0–1.2)
CO2: 24 mmol/L (ref 20–29)
Calcium: 9.3 mg/dL (ref 8.7–10.2)
Chloride: 100 mmol/L (ref 96–106)
Creatinine, Ser: 0.85 mg/dL (ref 0.76–1.27)
Globulin, Total: 2.8 g/dL (ref 1.5–4.5)
Glucose: 86 mg/dL (ref 70–99)
Potassium: 4.3 mmol/L (ref 3.5–5.2)
Sodium: 139 mmol/L (ref 134–144)
Total Protein: 7.1 g/dL (ref 6.0–8.5)
eGFR: 107 mL/min/{1.73_m2} (ref >59)

## 2022-04-12 LAB — LIPID PANEL
Chol/HDL Ratio: 4.1 ratio (ref 0.0–5.0)
Cholesterol, Total: 183 mg/dL (ref 100–199)
HDL: 45 mg/dL (ref >39)
LDL Chol Calc (NIH): 120 mg/dL — ABNORMAL HIGH (ref 0–99)
Triglycerides: 96 mg/dL (ref 0–149)
VLDL Cholesterol Cal: 18 mg/dL (ref 5–40)

## 2022-04-12 LAB — CBC
Hematocrit: 45.5 % (ref 37.5–51.0)
Hemoglobin: 15.1 g/dL (ref 13.0–17.7)
MCH: 30 pg (ref 26.6–33.0)
MCHC: 33.2 g/dL (ref 31.5–35.7)
MCV: 91 fL (ref 79–97)
Platelets: 262 10*3/uL (ref 150–450)
RBC: 5.03 x10E6/uL (ref 4.14–5.80)
RDW: 13.1 % (ref 11.6–15.4)
WBC: 6.7 10*3/uL (ref 3.4–10.8)

## 2022-04-12 LAB — PSA: Prostate Specific Ag, Serum: 0.2 ng/mL (ref 0.0–4.0)

## 2022-04-12 LAB — HEMOGLOBIN A1C
Est. average glucose Bld gHb Est-mCnc: 111 mg/dL
Hgb A1c MFr Bld: 5.5 % (ref 4.8–5.6)

## 2022-07-18 ENCOUNTER — Encounter: Payer: Self-pay | Admitting: Internal Medicine

## 2022-08-21 ENCOUNTER — Encounter: Payer: Self-pay | Admitting: Internal Medicine

## 2022-08-30 ENCOUNTER — Other Ambulatory Visit: Payer: Self-pay | Admitting: Medical

## 2023-04-15 ENCOUNTER — Ambulatory Visit (INDEPENDENT_AMBULATORY_CARE_PROVIDER_SITE_OTHER): Payer: No Typology Code available for payment source | Admitting: Medical

## 2023-04-15 VITALS — BP 124/80 | HR 71 | Ht 67.0 in | Wt 278.8 lb

## 2023-04-15 DIAGNOSIS — I10 Essential (primary) hypertension: Secondary | ICD-10-CM | POA: Diagnosis not present

## 2023-04-15 DIAGNOSIS — Z800 Family history of malignant neoplasm of digestive organs: Secondary | ICD-10-CM

## 2023-04-15 DIAGNOSIS — Z13220 Encounter for screening for lipoid disorders: Secondary | ICD-10-CM

## 2023-04-15 DIAGNOSIS — Z23 Encounter for immunization: Secondary | ICD-10-CM | POA: Diagnosis not present

## 2023-04-15 DIAGNOSIS — Z0000 Encounter for general adult medical examination without abnormal findings: Secondary | ICD-10-CM

## 2023-04-15 DIAGNOSIS — Z136 Encounter for screening for cardiovascular disorders: Secondary | ICD-10-CM | POA: Insufficient documentation

## 2023-04-15 DIAGNOSIS — Z6841 Body Mass Index (BMI) 40.0 and over, adult: Secondary | ICD-10-CM | POA: Diagnosis not present

## 2023-04-15 DIAGNOSIS — Z8042 Family history of malignant neoplasm of prostate: Secondary | ICD-10-CM

## 2023-04-15 DIAGNOSIS — Z125 Encounter for screening for malignant neoplasm of prostate: Secondary | ICD-10-CM

## 2023-04-15 DIAGNOSIS — Z1211 Encounter for screening for malignant neoplasm of colon: Secondary | ICD-10-CM

## 2023-04-15 LAB — POCT URINALYSIS DIP (PROADVANTAGE DEVICE)
Bilirubin, UA: NEGATIVE
Blood, UA: NEGATIVE
Glucose, UA: NEGATIVE mg/dL
Ketones, POC UA: NEGATIVE mg/dL
Leukocytes, UA: NEGATIVE
Nitrite, UA: NEGATIVE
Protein Ur, POC: NEGATIVE mg/dL
Specific Gravity, Urine: 1.01
Urobilinogen, Ur: NEGATIVE
pH, UA: 7 (ref 5.0–8.0)

## 2023-04-15 MED ORDER — AMLODIPINE BESYLATE 10 MG PO TABS
10.0000 mg | ORAL_TABLET | Freq: Every day | ORAL | 3 refills | Status: DC
Start: 2023-04-15 — End: 2024-04-08

## 2023-04-15 NOTE — Progress Notes (Signed)
Subjective:   HPI  Steven Mullins is a 47 y.o. male who presents for Chief Complaint  Patient presents with   Annual Exam    Fasting cpe, no concerns    Patient Care Team: Matti Killingsworth, Cleda Mccreedy as PCP - General (Family Medicine) Dr. Jeani Hawking, GI Eye doctor Dentist   Concerns: Compliant with BP medication  Reviewed their medical, surgical, family, social, medication, and allergy history and updated chart as appropriate.  No Known Allergies  Past Medical History:  Diagnosis Date   Allergic rhinitis    Colon polyp 04/2016   Erectile dysfunction    Family history of colon cancer in mother    Family history of prostate cancer    Hypertension 2019   Obesity    Prostatitis 03/2016    No current outpatient medications on file prior to visit.   No current facility-administered medications on file prior to visit.      Current Outpatient Medications:    amLODipine (NORVASC) 10 MG tablet, Take 1 tablet (10 mg total) by mouth daily., Disp: 90 tablet, Rfl: 3  Family History  Problem Relation Age of Onset   Cancer Mother 42       colon   Hypertension Mother    Thyroid disease Mother    Hypertension Father    Cancer Father 75       died of prostate cancer at 81   Thyroid disease Brother    Cancer Maternal Uncle        prostate   Heart disease Maternal Uncle        MI   Cancer Maternal Grandfather        prostate   Cancer Paternal Aunt    Diabetes Cousin    Diabetes Cousin    Stroke Neg Hx     Past Surgical History:  Procedure Laterality Date   APPENDECTOMY     age 33yo   COLONOSCOPY  04/2016   serrated polyp, repeat in 2022; Dr. Jeani Hawking   WISDOM TOOTH EXTRACTION       Review of Systems  Constitutional:  Negative for chills, fever, malaise/fatigue and weight loss.  HENT:  Negative for congestion, ear pain, hearing loss, sore throat and tinnitus.   Eyes:  Negative for blurred vision, pain and redness.  Respiratory:  Negative for cough,  hemoptysis and shortness of breath.   Cardiovascular:  Negative for chest pain, palpitations, orthopnea, claudication and leg swelling.  Gastrointestinal:  Negative for abdominal pain, blood in stool, constipation, diarrhea, nausea and vomiting.  Genitourinary:  Negative for dysuria, flank pain, frequency, hematuria and urgency.  Musculoskeletal:  Negative for falls, joint pain and myalgias.  Skin:  Negative for itching and rash.  Neurological:  Negative for dizziness, tingling, speech change, weakness and headaches.  Endo/Heme/Allergies:  Negative for polydipsia. Does not bruise/bleed easily.  Psychiatric/Behavioral:  Negative for depression and memory loss. The patient is not nervous/anxious and does not have insomnia.         Objective:  BP 124/80   Pulse 71   Ht 5\' 7"  (1.702 m)   Wt 278 lb 12.8 oz (126.5 kg)   BMI 43.67 kg/m   General appearance: alert, no distress, WD/WN, African American male Skin: unremarkable HEENT: normocephalic, conjunctiva/corneas normal, sclerae anicteric, PERRLA, EOMi, nares patent, no discharge or erythema, pharynx normal Oral cavity: MMM, tongue normal, teeth normal Neck: supple, no lymphadenopathy, no thyromegaly, no masses, normal ROM, no bruits Chest: non tender, normal shape and expansion Heart: RRR,  normal S1, S2, no murmurs Lungs: CTA bilaterally, no wheezes, rhonchi, or rales Abdomen: +bs, soft, non tender, non distended, no masses, no hepatomegaly, no splenomegaly, no bruits Back: non tender, normal ROM, no scoliosis Musculoskeletal: upper extremities non tender, no obvious deformity, normal ROM throughout, lower extremities non tender, no obvious deformity, normal ROM throughout Extremities: no edema, no cyanosis, no clubbing Pulses: 2+ symmetric, upper and lower extremities, normal cap refill Neurological: alert, oriented x 3, CN2-12 intact, strength normal upper extremities and lower extremities, sensation normal throughout, DTRs 2+  throughout, no cerebellar signs, gait normal Psychiatric: normal affect, behavior normal, pleasant  GU: normal male external genitalia,circumcised, nontender, no masses, no hernia, no lymphadenopathy Rectal: anus normal tone, prostate WNL    Assessment and Plan :   Encounter Diagnoses  Name Primary?   Routine general medical examination at a health care facility Yes   Essential hypertension, benign    BMI 40.0-44.9, adult (HCC)    Family history of colon cancer in mother    Family history of prostate cancer in father    Screening for lipid disorders    Screening for prostate cancer    Need for Tdap vaccination    Screening for heart disease    Screen for colon cancer     This visit was a preventative care visit, also known as wellness visit or routine physical.   Topics typically include healthy lifestyle, diet, exercise, preventative care, vaccinations, sick and well care, proper use of emergency dept and after hours care, as well as other concerns.     Separate significant issues discussed: Hypertension - continue your current medication    General Recommendations: Continue to return yearly for your annual wellness and preventative care visits.  This gives [place] a chance to discuss healthy lifestyle, exercise, vaccinations, review your chart record, and perform screenings where appropriate.  I recommend you see your eye doctor yearly for routine vision care.  I recommend you see your dentist yearly for routine dental care including hygiene visits twice yearly.   Vaccination  Immunization History  Administered Date(s) Administered   Influenza,inj,Quad PF,6+ Mos 08/08/2018, 11/29/2020   Influenza-Unspecified 08/11/2017, 08/11/2017   PFIZER(Purple Top)SARS-COV-2 Vaccination 02/06/2020, 02/27/2020, 11/29/2020   Tdap 04/10/2013, 04/15/2023    Vaccine recommendations: tdap  Vaccines administered today: Counseled on the Tdap (tetanus, diptheria, and acellular pertussis)  vaccine.  Vaccine information sheet given. Tdap vaccine given after consent obtained.   Screening for cancer: Colon cancer screening: We will refer you for repeat screening colonoscopy  Testicular cancer screening You should do a monthly self testicular exam if you are between 28-77 years old, and we typically do a testicular exam on the yearly physical for this same age group.   Prostate Cancer screening: The recommended prostate cancer screening test is a blood test called the prostate-specific antigen (PSA) test. PSA is a protein that is made in the prostate. As you age, your prostate naturally produces more PSA. Abnormally high PSA levels may be caused by: Prostate cancer. An enlarged prostate that is not caused by cancer (benign prostatic hyperplasia, or BPH). This condition is very common in older men. A prostate gland infection (prostatitis) or urinary tract infection. Certain medicines such as male hormones (like testosterone) or other medicines that raise testosterone levels. A rectal exam may be done as part of prostate cancer screening to help provide information about the size of your prostate gland. When a rectal exam is performed, it should be done after the PSA level is  drawn to avoid any effect on the results.   Skin cancer screening: Check your skin regularly for new changes, growing lesions, or other lesions of concern Come in for evaluation if you have skin lesions of concern.   Lung cancer screening: If you have a greater than 20 pack year history of tobacco use, then you may qualify for lung cancer screening with a chest CT scan.   Please call your insurance company to inquire about coverage for this test.   Pancreatic cancer:  no current screening test is available or routinely recommended. (risk factors: smoking, overweight or obese, diabetes, chronic pancreatitis, work exposure - dry cleaning, metal working, 47yo>, M>F, Tree surgeon, family hx/o, hereditary  breast, ovarian, melanoma, lynch, peutz-jeghers).  Symptoms: jaundice, dark urine, light color or greasy stools, itchy skin, belly or back pain, weight loss, poor appetite, nausea, vomiting, liver enlargement, DVT/blood clots.   We currently don't have screenings for other cancers besides breast, cervical, colon, and lung cancers.  If you have a strong family history of cancer or have other cancer screening concerns, please let me know.  Genetic testing referral is an option for individuals with high cancer risk in the family.  There are some other cancer screenings in development currently.   Bone health: Get at least 150 minutes of aerobic exercise weekly Get weight bearing exercise at least once weekly Bone density test:  A bone density test is an imaging test that uses a type of X-ray to measure the amount of calcium and other minerals in your bones. The test may be used to diagnose or screen you for a condition that causes weak or thin bones (osteoporosis), predict your risk for a broken bone (fracture), or determine how well your osteoporosis treatment is working. The bone density test is recommended for females 65 and older, or females or males <65 if certain risk factors such as thyroid disease, long term use of steroids such as for asthma or rheumatological issues, vitamin D deficiency, estrogen deficiency, family history of osteoporosis, self or family history of fragility fracture in first degree relative.    Heart health: Get at least 150 minutes of aerobic exercise weekly Limit alcohol It is important to maintain a healthy blood pressure and healthy cholesterol numbers  Heart disease screening: Screening for heart disease includes screening for blood pressure, fasting lipids, glucose/diabetes screening, BMI height to weight ratio, reviewed of smoking status, physical activity, and diet.    Goals include blood pressure 120/80 or less, maintaining a healthy lipid/cholesterol  profile, preventing diabetes or keeping diabetes numbers under good control, not smoking or using tobacco products, exercising most days per week or at least 150 minutes per week of exercise, and eating healthy variety of fruits and vegetables, healthy oils, and avoiding unhealthy food choices like fried food, fast food, high sugar and high cholesterol foods.    Other tests may possibly include EKG test, CT coronary calcium score, echocardiogram, exercise treadmill stress test.     Referral today for CT coronary heart test   Vascular disease screening: For higher risk individuals including smokers, diabetics, patients with known heart disease or high blood pressure, kidney disease, and others, screening for vascular disease or atherosclerosis of the arteries is available.  Examples may include carotid ultrasound, abdominal aortic ultrasound, ABI blood flow screening in the legs, thoracic aorta screening.    Medical care options: I recommend you continue to seek care here first for routine care.  We try really hard to have available  appointments Monday through Friday daytime hours for sick visits, acute visits, and physicals.  Urgent care should be used for after hours and weekends for significant issues that cannot wait till the next day.  The emergency department should be used for significant potentially life-threatening emergencies.  The emergency department is expensive, can often have long wait times for less significant concerns, so try to utilize primary care, urgent care, or telemedicine when possible to avoid unnecessary trips to the emergency department.  Virtual visits and telemedicine have been introduced since the pandemic started in 2020, and can be convenient ways to receive medical care.  We offer virtual appointments as well to assist you in a variety of options to seek medical care.   Legal  Take the time to do a last will and testament, Advanced Directives including Health Care  Power of Attorney and Living Will documents.  Don't leave your family with burdens that can be handled ahead of time.   Advanced Directives: I recommend you consider completing a Health Care Power of Attorney and Living Will.   These documents respect your wishes and help alleviate burdens on your loved ones if you were to become terminally ill or be in a position to need those documents enforced.    You can complete Advanced Directives yourself, have them notarized, then have copies made for our office, for you and for anybody you feel should have them in safe keeping.  Or, you can have an attorney prepare these documents.   If you haven't updated your Last Will and Testament in a while, it may be worthwhile having an attorney prepare these documents together and save on some costs.       Spiritual and Emotional Health Keeping a healthy spiritual life can help you better manage your physical health. Your spiritual life can help you to cope with any issues that may arise with your physical health.  Balance can keep [place] healthy and help [place] to recover.  If you are struggling with your spiritual health there are questions that you may want to ask yourself:  What makes me feel most complete? When do I feel most connected to the rest of the world? Where do I find the most inner strength? What am I doing when I feel whole?  Helpful tips: Being in nature. Some people feel very connected and at peace when they are walking outdoors or are outside. Helping others. Some feel the largest sense of wellbeing when they are of service to others. Being of service can take on many forms. It can be doing volunteer work, being kind to strangers, or offering a hand to a friend in need. Gratitude. Some people find they feel the most connected when they remain grateful. They may make lists of all the things they are grateful for or say a thank you out loud for all they have.    Emotional Health Are you in tune  with your emotional health?  Check out this link: http://www.marquez-love.com/    Financial Health Make sure you use a budget for your personal finances Make sure you are insured against risks (health insurance, life insurance, auto insurance, etc) Save more, spend less Set financial goals If you need help in this area, good resources include counseling through [employer] or other community resources, have a meeting with a Social research officer, government, and a good resource is the [employer] was seen today for annual exam.  Diagnoses and all orders  for this visit:  Routine general medical examination at a health care facility -     CBC with Differential/Platelet -     CMP14+EGFR -     Lipid panel -     PSA, total and free -     amLODipine (NORVASC) 10 MG tablet; Take 1 tablet (10 mg total) by mouth daily. -     Ambulatory referral to Gastroenterology -     POCT Urinalysis DIP (Proadvantage Device)  Essential hypertension, benign -     CBC with Differential/Platelet -     amLODipine (NORVASC) 10 MG tablet; Take 1 tablet (10 mg total) by mouth daily. -     POCT Urinalysis DIP (Proadvantage Device) -     CT CARDIAC SCORING (SELF PAY ONLY); Future  BMI 40.0-44.9, adult (HCC)  Family history of colon cancer in mother -     Ambulatory referral to Gastroenterology  Family history of prostate cancer in father  Screening for lipid disorders -     CT CARDIAC SCORING (SELF PAY ONLY); Future  Screening for prostate cancer  Need for Tdap vaccination -     Tdap vaccine greater than or equal to 7yo IM  Screening for heart disease -     CT CARDIAC SCORING (SELF PAY ONLY); Future  Screen for colon cancer -     Ambulatory referral to Gastroenterology     Follow-up pending labs, yearly for physical

## 2023-04-15 NOTE — Patient Instructions (Signed)
This visit was a preventative care visit, also known as wellness visit or routine physical.   Topics typically include healthy lifestyle, diet, exercise, preventative care, vaccinations, sick and well care, proper use of emergency dept and after hours care, as well as other concerns.     Separate significant issues discussed: Hypertension - continue your current medication    General Recommendations: Continue to return yearly for your annual wellness and preventative care visits.  This gives [place] a chance to discuss healthy lifestyle, exercise, vaccinations, review your chart record, and perform screenings where appropriate.  I recommend you see your eye doctor yearly for routine vision care.  I recommend you see your dentist yearly for routine dental care including hygiene visits twice yearly.   Vaccination  Immunization History  Administered Date(s) Administered   Influenza,inj,Quad PF,6+ Mos 08/08/2018, 11/29/2020   Influenza-Unspecified 08/11/2017, 08/11/2017   PFIZER(Purple Top)SARS-COV-2 Vaccination 02/06/2020, 02/27/2020, 11/29/2020   Tdap 04/10/2013    Vaccine recommendations: tdap  Vaccines administered today: Counseled on the Tdap (tetanus, diptheria, and acellular pertussis) vaccine.  Vaccine information sheet given. Tdap vaccine given after consent obtained.   Screening for cancer: Colon cancer screening: We will refer you for repeat screening colonoscopy  Testicular cancer screening You should do a monthly self testicular exam if you are between 4-13 years old, and we typically do a testicular exam on the yearly physical for this same age group.   Prostate Cancer screening: The recommended prostate cancer screening test is a blood test called the prostate-specific antigen (PSA) test. PSA is a protein that is made in the prostate. As you age, your prostate naturally produces more PSA. Abnormally high PSA levels may be caused by: Prostate cancer. An enlarged  prostate that is not caused by cancer (benign prostatic hyperplasia, or BPH). This condition is very common in older men. A prostate gland infection (prostatitis) or urinary tract infection. Certain medicines such as male hormones (like testosterone) or other medicines that raise testosterone levels. A rectal exam may be done as part of prostate cancer screening to help provide information about the size of your prostate gland. When a rectal exam is performed, it should be done after the PSA level is drawn to avoid any effect on the results.   Skin cancer screening: Check your skin regularly for new changes, growing lesions, or other lesions of concern Come in for evaluation if you have skin lesions of concern.   Lung cancer screening: If you have a greater than 20 pack year history of tobacco use, then you may qualify for lung cancer screening with a chest CT scan.   Please call your insurance company to inquire about coverage for this test.   Pancreatic cancer:  no current screening test is available or routinely recommended. (risk factors: smoking, overweight or obese, diabetes, chronic pancreatitis, work exposure - dry cleaning, metal working, 47yo>, M>F, Tree surgeon, family hx/o, hereditary breast, ovarian, melanoma, lynch, peutz-jeghers).  Symptoms: jaundice, dark urine, light color or greasy stools, itchy skin, belly or back pain, weight loss, poor appetite, nause, vomiting, liver enlargement, DVT/blood clots.   We currently don't have screenings for other cancers besides breast, cervical, colon, and lung cancers.  If you have a strong family history of cancer or have other cancer screening concerns, please let me know.  Genetic testing referral is an option for individuals with high cancer risk in the family.  There are some other cancer screenings in development currently.   Bone health: Get at least 150  minutes of aerobic exercise weekly Get weight bearing exercise at least once  weekly Bone density test:  A bone density test is an imaging test that uses a type of X-ray to measure the amount of calcium and other minerals in your bones. The test may be used to diagnose or screen you for a condition that causes weak or thin bones (osteoporosis), predict your risk for a broken bone (fracture), or determine how well your osteoporosis treatment is working. The bone density test is recommended for females 65 and older, or females or males <65 if certain risk factors such as thyroid disease, long term use of steroids such as for asthma or rheumatological issues, vitamin D deficiency, estrogen deficiency, family history of osteoporosis, self or family history of fragility fracture in first degree relative.    Heart health: Get at least 150 minutes of aerobic exercise weekly Limit alcohol It is important to maintain a healthy blood pressure and healthy cholesterol numbers  Heart disease screening: Screening for heart disease includes screening for blood pressure, fasting lipids, glucose/diabetes screening, BMI height to weight ratio, reviewed of smoking status, physical activity, and diet.    Goals include blood pressure 120/80 or less, maintaining a healthy lipid/cholesterol profile, preventing diabetes or keeping diabetes numbers under good control, not smoking or using tobacco products, exercising most days per week or at least 150 minutes per week of exercise, and eating healthy variety of fruits and vegetables, healthy oils, and avoiding unhealthy food choices like fried food, fast food, high sugar and high cholesterol foods.    Other tests may possibly include EKG test, CT coronary calcium score, echocardiogram, exercise treadmill stress test.     Referral today for CT coronary heart test   Vascular disease screening: For higher risk individuals including smokers, diabetics, patients with known heart disease or high blood pressure, kidney disease, and others, screening  for vascular disease or atherosclerosis of the arteries is available.  Examples may include carotid ultrasound, abdominal aortic ultrasound, ABI blood flow screening in the legs, thoracic aorta screening.    Medical care options: I recommend you continue to seek care here first for routine care.  We try really hard to have available appointments Monday through Friday daytime hours for sick visits, acute visits, and physicals.  Urgent care should be used for after hours and weekends for significant issues that cannot wait till the next day.  The emergency department should be used for significant potentially life-threatening emergencies.  The emergency department is expensive, can often have long wait times for less significant concerns, so try to utilize primary care, urgent care, or telemedicine when possible to avoid unnecessary trips to the emergency department.  Virtual visits and telemedicine have been introduced since the pandemic started in 2020, and can be convenient ways to receive medical care.  We offer virtual appointments as well to assist you in a variety of options to seek medical care.   Legal  Take the time to do a last will and testament, Advanced Directives including Health Care Power of Attorney and Living Will documents.  Don't leave your family with burdens that can be handled ahead of time.   Advanced Directives: I recommend you consider completing a Health Care Power of Attorney and Living Will.   These documents respect your wishes and help alleviate burdens on your loved ones if you were to become terminally ill or be in a position to need those documents enforced.    You can complete Advanced Directives  yourself, have them notarized, then have copies made for our office, for you and for anybody you feel should have them in safe keeping.  Or, you can have an attorney prepare these documents.   If you haven't updated your Last Will and Testament in a while, it may be  worthwhile having an attorney prepare these documents together and save on some costs.       Spiritual and Emotional Health Keeping a healthy spiritual life can help you better manage your physical health. Your spiritual life can help you to cope with any issues that may arise with your physical health.  Balance can keep [place] healthy and help [place] to recover.  If you are struggling with your spiritual health there are questions that you may want to ask yourself:  What makes me feel most complete? When do I feel most connected to the rest of the world? Where do I find the most inner strength? What am I doing when I feel whole?  Helpful tips: Being in nature. Some people feel very connected and at peace when they are walking outdoors or are outside. Helping others. Some feel the largest sense of wellbeing when they are of service to others. Being of service can take on many forms. It can be doing volunteer work, being kind to strangers, or offering a hand to a friend in need. Gratitude. Some people find they feel the most connected when they remain grateful. They may make lists of all the things they are grateful for or say a thank you out loud for all they have.    Emotional Health Are you in tune with your emotional health?  Check out this link: http://www.marquez-love.com/    Financial Health Make sure you use a budget for your personal finances Make sure you are insured against risks (health insurance, life insurance, auto insurance, etc) Save more, spend less Set financial goals If you need help in this area, good resources include counseling through [employer] or other community resources, have a meeting with a Social research officer, government, and a good resource is the [employer]

## 2023-04-16 LAB — PSA, TOTAL AND FREE
PSA, Free Pct: 35 %
PSA, Free: 0.07 ng/mL
Prostate Specific Ag, Serum: 0.2 ng/mL (ref 0.0–4.0)

## 2023-04-16 LAB — LIPID PANEL
Chol/HDL Ratio: 3.8 ratio (ref 0.0–5.0)
Cholesterol, Total: 189 mg/dL (ref 100–199)
HDL: 50 mg/dL (ref >39)
LDL Chol Calc (NIH): 127 mg/dL — ABNORMAL HIGH (ref 0–99)
Triglycerides: 64 mg/dL (ref 0–149)
VLDL Cholesterol Cal: 12 mg/dL (ref 5–40)

## 2023-04-16 LAB — CMP14+EGFR
ALT: 22 IU/L (ref 0–44)
AST: 29 IU/L (ref 0–40)
Albumin/Globulin Ratio: 1.8 (ref 1.2–2.2)
Albumin: 4.4 g/dL (ref 4.1–5.1)
Alkaline Phosphatase: 94 IU/L (ref 44–121)
BUN/Creatinine Ratio: 12 (ref 9–20)
BUN: 11 mg/dL (ref 6–24)
Bilirubin Total: 0.8 mg/dL (ref 0.0–1.2)
CO2: 22 mmol/L (ref 20–29)
Calcium: 9.2 mg/dL (ref 8.7–10.2)
Chloride: 101 mmol/L (ref 96–106)
Creatinine, Ser: 0.9 mg/dL (ref 0.76–1.27)
Globulin, Total: 2.5 g/dL (ref 1.5–4.5)
Glucose: 86 mg/dL (ref 70–99)
Potassium: 4.7 mmol/L (ref 3.5–5.2)
Sodium: 139 mmol/L (ref 134–144)
Total Protein: 6.9 g/dL (ref 6.0–8.5)
eGFR: 105 mL/min/{1.73_m2} (ref >59)

## 2023-04-16 LAB — CBC WITH DIFFERENTIAL/PLATELET
Basophils Absolute: 0 10*3/uL (ref 0.0–0.2)
Basos: 0 %
EOS (ABSOLUTE): 0.3 10*3/uL (ref 0.0–0.4)
Eos: 4 %
Hematocrit: 46.4 % (ref 37.5–51.0)
Hemoglobin: 15.5 g/dL (ref 13.0–17.7)
Immature Grans (Abs): 0 10*3/uL (ref 0.0–0.1)
Immature Granulocytes: 0 %
Lymphocytes Absolute: 2 10*3/uL (ref 0.7–3.1)
Lymphs: 35 %
MCH: 30.6 pg (ref 26.6–33.0)
MCHC: 33.4 g/dL (ref 31.5–35.7)
MCV: 92 fL (ref 79–97)
Monocytes Absolute: 0.5 10*3/uL (ref 0.1–0.9)
Monocytes: 9 %
Neutrophils Absolute: 2.9 10*3/uL (ref 1.4–7.0)
Neutrophils: 52 %
Platelets: 257 10*3/uL (ref 150–450)
RBC: 5.07 x10E6/uL (ref 4.14–5.80)
RDW: 13 % (ref 11.6–15.4)
WBC: 5.7 10*3/uL (ref 3.4–10.8)

## 2023-04-16 NOTE — Progress Notes (Signed)
Results sent through MyChart

## 2023-05-01 ENCOUNTER — Ambulatory Visit (HOSPITAL_BASED_OUTPATIENT_CLINIC_OR_DEPARTMENT_OTHER): Payer: 59

## 2023-05-03 ENCOUNTER — Other Ambulatory Visit (HOSPITAL_BASED_OUTPATIENT_CLINIC_OR_DEPARTMENT_OTHER): Payer: 59

## 2023-05-03 ENCOUNTER — Ambulatory Visit (HOSPITAL_BASED_OUTPATIENT_CLINIC_OR_DEPARTMENT_OTHER)
Admission: RE | Admit: 2023-05-03 | Discharge: 2023-05-03 | Disposition: A | Discharge status: Home / Self Care | Payer: 59 | Source: Ambulatory Visit | Attending: Medical | Admitting: Medical

## 2023-05-03 DIAGNOSIS — I10 Essential (primary) hypertension: Secondary | ICD-10-CM

## 2023-05-03 DIAGNOSIS — Z13220 Encounter for screening for lipoid disorders: Secondary | ICD-10-CM

## 2023-05-03 DIAGNOSIS — Z136 Encounter for screening for cardiovascular disorders: Secondary | ICD-10-CM

## 2023-05-05 NOTE — Progress Notes (Signed)
Results sent through MyChart

## 2023-05-13 HISTORY — PX: COLONOSCOPY: SHX5424

## 2023-05-23 LAB — HM COLONOSCOPY

## 2023-05-28 ENCOUNTER — Encounter: Payer: Self-pay | Admitting: Internal Medicine

## 2023-06-05 ENCOUNTER — Encounter: Payer: Self-pay | Admitting: Medical

## 2024-02-04 ENCOUNTER — Ambulatory Visit: Payer: Self-pay

## 2024-02-04 NOTE — Telephone Encounter (Signed)
 Chief Complaint: Irregular heartbeat Symptoms: Palpitations, feels fluttering, cough Frequency: Come and go Pertinent Negatives: Patient denies current symptoms Disposition: [] ED /[] Urgent Care (no appt availability in office) / [x] Appointment(In office/virtual)/ []  [place] [facility]/ [] Home Care/ [] Refused Recommended Disposition /[] [place] [facility]/ []  Follow-up with PCP Additional Notes: Patient states he has been experiencing what he believes to be an irregular heartbeat stating it feels like the heart is fluttering sometimes with a few extra beats. Patient states when it happens he has a cough first and the fluttering last for about 10 minutes. Care advice was given and patient has been scheduled for an office visit tomorrow morning with PCP for evaluation.   Copied from CRM [id number]. Topic: Clinical - Red Word Triage >> Feb 04, 2024  8:37 AM Elle L wrote: Red Word that prompted transfer to Nurse Triage: The patient has been having an irregular heartbeat. It is when he is sitting or laying. Reason for Disposition  [1] Palpitations AND [2] no improvement after using Care Advice  Answer Assessment - Initial Assessment Questions 1. DESCRIPTION: "Please describe your heart rate or heartbeat that you are having" (e.g., fast/slow, regular/irregular, skipped or extra beats, "palpitations")     Palpitations and irregular 2. ONSET: "When did it start?" (Minutes, hours or days)      A couple of weeks  3. DURATION: "How long does it last" (e.g., seconds, minutes, hours)     Varies from a few minutes to about a half hour  4. PATTERN "Does it come and go, or has it been constant since it started?"  "Does it get worse with exertion?"   "Are you feeling it now?"     Come and go 5. TAP: "Using your hand, can you tap out what you are feeling on a chair or table in front of you, so that I can hear?" (Note: not all patients can do this)       N/A 6. HEART RATE: "Can you tell me your heart rate?"  "How many beats in 15 seconds?"  (Note: not all patients can do this)       Right now it seems normal  7. RECURRENT SYMPTOM: "Have you ever had this before?" If Yes, ask: "When was the last time?" and "What happened that time?"      No  8. CAUSE: "What do you think is causing the palpitations?"     I'm not sure  9. CARDIAC HISTORY: "Do you have any history of heart disease?" (e.g., heart attack, angina, bypass surgery, angioplasty, arrhythmia)      No  10. OTHER SYMPTOMS: "Do you have any other symptoms?" (e.g., dizziness, chest pain, sweating, difficulty breathing)       Coughing  Protocols used: Heart Rate and Heartbeat Questions-A-AH

## 2024-02-05 ENCOUNTER — Ambulatory Visit (INDEPENDENT_AMBULATORY_CARE_PROVIDER_SITE_OTHER): Admitting: Medical

## 2024-02-05 ENCOUNTER — Ambulatory Visit: Attending: Medical

## 2024-02-05 VITALS — BP 120/72 | HR 93 | Wt 284.2 lb

## 2024-02-05 DIAGNOSIS — R002 Palpitations: Secondary | ICD-10-CM | POA: Diagnosis not present

## 2024-02-05 DIAGNOSIS — R0683 Snoring: Secondary | ICD-10-CM

## 2024-02-05 DIAGNOSIS — R9431 Abnormal electrocardiogram [ECG] [EKG]: Secondary | ICD-10-CM

## 2024-02-05 DIAGNOSIS — I10 Essential (primary) hypertension: Secondary | ICD-10-CM

## 2024-02-05 NOTE — Progress Notes (Signed)
 Put zio patch order in to cardiology and I have fax order to [facility] for home sleep study

## 2024-02-05 NOTE — Addendum Note (Signed)
 Addended by: Herminio Commons A on: 02/05/2024 11:08 AM   Modules accepted: Orders

## 2024-02-05 NOTE — Addendum Note (Signed)
 Addended by: Jac Canavan on: 02/05/2024 04:57 PM   Modules accepted: Orders

## 2024-02-05 NOTE — Progress Notes (Signed)
 Subjective:  Steven Mullins is a 48 y.o. male who presents for Chief Complaint  Patient presents with   irregular heartbeat    Irregular heartbeat to 60-80. No chest pain, no SOB     He is here for concern for irregular heart beat.  Got sick in late February, cough, congestion and that resolved.  But as that resolved, started having some irregular heart beat or flutter.  Would last for minutes on and off.  Has been getting these maybe once daily but sometimes more.   Walking on treadmill daily without issues  With the episodes, no SOB, no lightheaded or dizzy, no pain, no sweats, no nausea.  He has been limiting caffeine, but drinks tea or soft drink every now and then.  He endorses snoring, but no witnessed apnea, no fatigue.  Wife comments on snoring but no apnea.  No prior sleep study.   No other aggravating or relieving factors.    No other c/o.  Past Medical History:  Diagnosis Date   Allergic rhinitis    Colon polyp 04/2016   Erectile dysfunction    Family history of colon cancer in mother    Family history of prostate cancer    Hypertension 2019   Obesity    Prostatitis 03/2016   Current Outpatient Medications on File Prior to Visit  Medication Sig Dispense Refill   amLODipine (NORVASC) 10 MG tablet Take 1 tablet (10 mg total) by mouth daily. 90 tablet 3   No current facility-administered medications on file prior to visit.     The following portions of the patient's history were reviewed and updated as appropriate: allergies, current medications, past family history, past medical history, past social history, past surgical history and problem list.  ROS Otherwise as in subjective above    Objective: BP 120/72   Pulse 93   Wt 284 lb 3.2 oz (128.9 kg)   SpO2 97%   BMI 44.51 kg/m   Wt Readings from Last 3 Encounters:  02/05/24 284 lb 3.2 oz (128.9 kg)  04/15/23 278 lb 12.8 oz (126.5 kg)  04/11/22 284 lb 9.6 oz (129.1 kg)    General appearance: alert,  no distress, well developed, well nourished HEENT: normocephalic, sclerae anicteric, conjunctiva pink and moist, TMs pearly, nares patent, no discharge or erythema, pharynx normal Oral cavity: MMM, no lesions Neck: supple, no lymphadenopathy, no thyromegaly, no masses, no JVD or bruit Heart: RRR, normal S1, S2, no murmurs Lungs: CTA bilaterally, no wheezes, rhonchi, or rales Pulses: 2+ radial pulses, 2+ pedal pulses, normal cap refill Ext: no edema  He notes neck size of 18"  EKG reviewed, EKG shows RSR pattern, premature atrial complex, new changes from prior EKG in 2019    Assessment: Encounter Diagnoses  Name Primary?   Palpitation Yes   Snoring    Essential hypertension, benign    Abnormal EKG      Plan: We discussed symptoms, concerns, possible causes.  Advise he limit caffeine, try to get adequate sleep every night.  We will pursue sleep study as there is potential for sleep apnea  We will get him set up for event monitor Zio patch  Labs as below    Steven Mullins was seen today for irregular heartbeat.  Diagnoses and all orders for this visit:  Palpitation -     Basic metabolic panel -     TSH + free T4  Snoring -     Basic metabolic panel -     TSH +  free T4  Essential hypertension, benign -     Basic metabolic panel -     TSH + free T4  Abnormal EKG    Follow up: pending studies

## 2024-02-05 NOTE — Progress Notes (Unsigned)
 EP to read.

## 2024-02-06 LAB — TSH+FREE T4
Free T4: 1.24 ng/dL (ref 0.82–1.77)
TSH: 1.24 u[IU]/mL (ref 0.450–4.500)

## 2024-02-06 LAB — BASIC METABOLIC PANEL WITH GFR
BUN/Creatinine Ratio: 12 (ref 9–20)
BUN: 10 mg/dL (ref 6–24)
CO2: 22 mmol/L (ref 20–29)
Calcium: 9 mg/dL (ref 8.7–10.2)
Chloride: 102 mmol/L (ref 96–106)
Creatinine, Ser: 0.86 mg/dL (ref 0.76–1.27)
Glucose: 88 mg/dL (ref 70–99)
Potassium: 4.2 mmol/L (ref 3.5–5.2)
Sodium: 138 mmol/L (ref 134–144)
eGFR: 106 mL/min/{1.73_m2} (ref >59)

## 2024-02-06 NOTE — Progress Notes (Signed)
 Make sure we have referred for sleep study with [facility] and the event monitor ZIO

## 2024-02-17 ENCOUNTER — Ambulatory Visit: Payer: Self-pay

## 2024-02-17 ENCOUNTER — Other Ambulatory Visit: Payer: Self-pay

## 2024-02-17 ENCOUNTER — Encounter (HOSPITAL_COMMUNITY): Payer: Self-pay

## 2024-02-17 ENCOUNTER — Ambulatory Visit (HOSPITAL_COMMUNITY): Admission: EM | Admit: 2024-02-17 | Discharge: 2024-02-17 | Disposition: A | Discharge status: Home / Self Care

## 2024-02-17 DIAGNOSIS — R002 Palpitations: Secondary | ICD-10-CM

## 2024-02-17 DIAGNOSIS — M79601 Pain in right arm: Secondary | ICD-10-CM | POA: Diagnosis not present

## 2024-02-17 NOTE — Telephone Encounter (Signed)
 We have no work ins today but I see patient is currently at [facility]

## 2024-02-17 NOTE — Telephone Encounter (Signed)
 Copied from CRM [id number]. Topic: Clinical - Red Word Triage >> Feb 17, 2024  2:05 PM Sonny Dandy B wrote: [facility] that prompted transfer to Nurse Triage: heart palitations    Chief Complaint: Continued palpitations, lasts a few seconds. Has right arm "discomfort". Asking to be worked in. Symptoms: Above Frequency: 2 weeks Pertinent Negatives: Patient denies chest pain or SOB Disposition: [] ED /[] Urgent Care (no appt availability in office) / [] Appointment(In office/virtual)/ []  [place] [facility]/ [] Home Care/ [] Refused Recommended Disposition /[] [place] [facility]/ [x]  Follow-up with PCP Additional Notes: Please advise pt.  Reason for Disposition  [1] Palpitations AND [2] no improvement after using Care Advice  Answer Assessment - Initial Assessment Questions 1. DESCRIPTION: "Please describe your heart rate or heartbeat that you are having" (e.g., fast/slow, regular/irregular, skipped or extra beats, "palpitations")     Palpitations, right arm muscle pain 2. ONSET: "When did it start?" (Minutes, hours or days)      2 weeks ago 3. DURATION: "How long does it last" (e.g., seconds, minutes, hours)     Seconds 4. PATTERN "Does it come and go, or has it been constant since it started?"  "Does it get worse with exertion?"   "Are you feeling it now?"     Comes and goes 5. TAP: "Using your hand, can you tap out what you are feeling on a chair or table in front of you, so that I can hear?" (Note: not all patients can do this)       no 6. HEART RATE: "Can you tell me your heart rate?" "How many beats in 15 seconds?"  (Note: not all patients can do this)        7. RECURRENT SYMPTOM: "Have you ever had this before?" If Yes, ask: "When was the last time?" and "What happened that time?"      yes 8. CAUSE: "What do you think is causing the palpitations?"     Unsure 9. CARDIAC HISTORY: "Do you have any history of heart disease?" (e.g., heart attack, angina, bypass surgery, angioplasty,  arrhythmia)      No 10. OTHER SYMPTOMS: "Do you have any other symptoms?" (e.g., dizziness, chest pain, sweating, difficulty breathing)       No 11. PREGNANCY: "Is there any chance you are pregnant?" "When was your last menstrual period?"       N/a  Protocols used: Heart Rate and Heartbeat Questions-A-AH

## 2024-02-17 NOTE — ED Triage Notes (Signed)
 Patient reports that he had heart palpitations around 1200 today that lasted 5 minutes. Patient also reports right arm tightness x 2 days.

## 2024-02-17 NOTE — Discharge Instructions (Signed)
-  Based on new onset of symptoms of right arm pain and heart palpitations, recommend follow-up in ER for further evaluation and management. -With recent history of cardiovascular concerns and Holter monitor test, with these new symptoms stat laboratory testing and advanced imaging may be recommended to discover possible cause of symptoms -Please go directly to emergency department following discharge from urgent care to be evaluated. -EKG performed in UC shows sinus rhythm with premature atrial complexes with aberrant conduction, ventricular rate of 81 bpm, slightly abnormal EKG.  No STEMI.

## 2024-02-17 NOTE — ED Notes (Signed)
 Patient is being discharged from the Urgent Care and sent to the Emergency Department via POV . Per Glorianne Manchester, NP, patient is in need of higher level of care due to new onset right arm pain and palpitations. Patient is aware and verbalizes understanding of plan of care.  Vitals:   02/17/24 1512  BP: 127/85  Pulse: 96  Resp: 16  Temp: 98.5 F (36.9 C)  SpO2: 96%

## 2024-02-17 NOTE — ED Provider Notes (Signed)
 [facility] [place]  Note:  This document was prepared using Dragon voice recognition software and may include unintentional dictation errors.  MRN: [id number] DOB: 1976-03-25  Subjective:   Steven Mullins is a 48 y.o. male presenting for palpitations earlier today around 12:00 that lasted for about 5 minutes and right arm aching with no known cause.  Patient reports recent evaluation by primary care provider which revealed abnormality to EKG and prompted a Holter monitor testing at home.  Patient reports that he just completed testing and sent back Holter monitor system.  No shortness of breath, chest pain, weakness, dizziness.  Patient became concerned after palpitations and right arm discomfort that it may be something that need to be evaluated and came to urgent care.  Patient has scheduled follow-up with primary care provider next week for discussion of Holter monitor test.  No current facility-administered medications for this encounter.  Current Outpatient Medications:    amLODipine (NORVASC) 10 MG tablet, Take 1 tablet (10 mg total) by mouth daily., Disp: 90 tablet, Rfl: 3   No Known Allergies  Past Medical History:  Diagnosis Date   Allergic rhinitis    Colon polyp 04/2016   Erectile dysfunction    Family history of colon cancer in mother    Family history of prostate cancer    Hypertension 2019   Obesity    Prostatitis 03/2016     Past Surgical History:  Procedure Laterality Date   APPENDECTOMY     age 72yo   COLONOSCOPY  04/2016   serrated polyp, repeat in 2022; Dr. Jeani Hawking   WISDOM TOOTH EXTRACTION      Family History  Problem Relation Age of Onset   Cancer Mother 25       colon   Hypertension Mother    Thyroid disease Mother    Hypertension Father    Cancer Father 31       died of prostate cancer at 7   Thyroid disease Brother    Cancer Maternal Uncle        prostate   Heart disease Maternal Uncle        MI   Cancer Maternal Grandfather         prostate   Cancer Paternal Aunt    Diabetes Cousin    Diabetes Cousin    Stroke Neg Hx     Social History   Tobacco Use   Smoking status: Never   Smokeless tobacco: Never  Vaping Use   Vaping status: Never Used  Substance Use Topics   Alcohol use: Yes    Comment: occasional   Drug use: No    ROS Refer to HPI for ROS details.  Objective:   Vitals: BP 127/85 (BP Location: Left Arm)   Pulse 96   Temp 98.5 F (36.9 C) (Oral)   Resp 16   SpO2 96%   Physical Exam Vitals and nursing note reviewed.  Constitutional:      General: He is not in acute distress.    Appearance: Normal appearance. He is well-developed. He is not ill-appearing, toxic-appearing or diaphoretic.  HENT:     Head: Normocephalic.  Cardiovascular:     Rate and Rhythm: Normal rate and regular rhythm.     Heart sounds: No murmur heard. Pulmonary:     Effort: Pulmonary effort is normal. No respiratory distress.     Breath sounds: Normal breath sounds. No stridor. No wheezing, rhonchi or rales.  Skin:    General: Skin is warm  and dry.  Neurological:     General: No focal deficit present.     Mental Status: He is alert and oriented to person, place, and time.  Psychiatric:        Mood and Affect: Mood normal.     Procedures  No results found for this or any previous visit (from the past 24 hours).  Assessment and Plan :   PDMP not reviewed this encounter.  1. Heart palpitations   2. Right arm pain    -Based on new onset of symptoms of right arm pain and heart palpitations, recommend follow-up in ER for further evaluation and management. -With recent history of cardiovascular concerns and Holter monitor test, with these new symptoms stat laboratory testing and advanced imaging may be recommended to discover possible cause of symptoms -Please go directly to emergency department following discharge from urgent care to be evaluated. -EKG performed in UC shows sinus rhythm with premature  atrial complexes with aberrant conduction, ventricular rate of 81 bpm, slightly abnormal EKG.  No STEMI. -Continue to monitor symptoms for any change in severity if there is any escalation of current symptoms or development of new symptoms follow-up in ER for further evaluation and management.  Lucky Cowboy   [place], [place], [place] 02/17/24 1555

## 2024-02-18 ENCOUNTER — Telehealth: Payer: Self-pay | Admitting: *Deleted

## 2024-02-18 NOTE — Telephone Encounter (Signed)
 Pt was notified.

## 2024-02-18 NOTE — Telephone Encounter (Signed)
 Can you tell me how long it will take to get the results back for the Zio patch

## 2024-02-18 NOTE — Telephone Encounter (Signed)
 Left message asking patient to please call back or send MyChart message-asking if he is wearing Zio patch and what status is.

## 2024-02-18 NOTE — Telephone Encounter (Signed)
 Copied from CRM [id number]. Topic: General - Call Back - No Documentation >> Feb 18, 2024  2:03 PM Marland Kitchen D wrote: [employer] Zio patch and sent back in Friday  When I asked the status he didn't know what I meant by that and said he has had a few events

## 2024-02-22 DIAGNOSIS — I10 Essential (primary) hypertension: Secondary | ICD-10-CM | POA: Diagnosis not present

## 2024-02-22 DIAGNOSIS — R002 Palpitations: Secondary | ICD-10-CM

## 2024-02-22 DIAGNOSIS — R9431 Abnormal electrocardiogram [ECG] [EKG]: Secondary | ICD-10-CM | POA: Diagnosis not present

## 2024-03-04 ENCOUNTER — Ambulatory Visit (INDEPENDENT_AMBULATORY_CARE_PROVIDER_SITE_OTHER): Admitting: Medical

## 2024-03-04 VITALS — BP 120/70 | HR 80 | Wt 282.0 lb

## 2024-03-04 DIAGNOSIS — Z6841 Body Mass Index (BMI) 40.0 and over, adult: Secondary | ICD-10-CM | POA: Diagnosis not present

## 2024-03-04 DIAGNOSIS — G4733 Obstructive sleep apnea (adult) (pediatric): Secondary | ICD-10-CM | POA: Diagnosis not present

## 2024-03-04 DIAGNOSIS — I471 Supraventricular tachycardia, unspecified: Secondary | ICD-10-CM

## 2024-03-04 DIAGNOSIS — R002 Palpitations: Secondary | ICD-10-CM | POA: Diagnosis not present

## 2024-03-04 DIAGNOSIS — I493 Ventricular premature depolarization: Secondary | ICD-10-CM

## 2024-03-04 NOTE — Progress Notes (Signed)
 Subjective:  Steven Mullins is a 48 y.o. male who presents for Chief Complaint  Patient presents with   Consult    Discuss sleep study and heart monitor results     Here to discuss recent testing for Zio patch monitor for palpitations and sleep study.   Since last visit has been working to lose weight, eat healthier and cut out certainly things in diet.  Has been exercising more.   Walking on average 3mi daily.  No prior CPAP use.  Palpations are random and not very frequent. No other aggravating or relieving factors.    No other c/o.  Past Medical History:  Diagnosis Date   Allergic rhinitis    Colon polyp 04/2016   Erectile dysfunction    Family history of colon cancer in mother    Family history of prostate cancer    Hypertension 2019   Obesity    Prostatitis 03/2016   Current Outpatient Medications on File Prior to Visit  Medication Sig Dispense Refill   amLODipine  (NORVASC ) 10 MG tablet Take 1 tablet (10 mg total) by mouth daily. 90 tablet 3   No current facility-administered medications on file prior to visit.    The following portions of the patient's history were reviewed and updated as appropriate: allergies, current medications, past family history, past medical history, past social history, past surgical history and problem list.  ROS Otherwise as in subjective above  Objective: BP 120/70   Pulse 80   Wt 282 lb (127.9 kg)   BMI 44.17 kg/m   General appearance: alert, no distress, well developed, well nourished    Assessment: Encounter Diagnoses  Name Primary?   OSA (obstructive sleep apnea) Yes   BMI 40.0-44.9, adult (HCC)    Palpitation    PVC (premature ventricular contraction)    SVT (supraventricular tachycardia) (HCC)      Plan: Discussed recent Zio monitor showing some PVCs/PACs and some SVT.   Sleep study shows moderate OSA 18.8 AHI and desaturation.  Discussed risks of OSA  Discussed treatment options for OSA.  He agrees to trial of  CPAP.  Work on efforts to lose weight.  Counseled on diet and exercise.  Offered pharmaceutical study for weight loss or weight loss medication but he declines.  Discussed possible beta blocker for palpitations but he wants to hold off on this for now.   He will work on lifestyle changes, weight loss and begin trial of CPAP.    Steven Mullins was seen today for consult.  Diagnoses and all orders for this visit:  OSA (obstructive sleep apnea)  BMI 40.0-44.9, adult (HCC)  Palpitation  PVC (premature ventricular contraction)  SVT (supraventricular tachycardia) (HCC)    Follow up: as scheduled for well visit in June

## 2024-03-05 NOTE — Progress Notes (Signed)
 Ordered and sent to [facility]

## 2024-03-05 NOTE — Addendum Note (Signed)
 Addended by: Charliene Conte A on: 03/05/2024 11:59 AM   Modules accepted: Orders

## 2024-04-07 ENCOUNTER — Other Ambulatory Visit: Payer: Self-pay | Admitting: Medical

## 2024-04-07 DIAGNOSIS — I10 Essential (primary) hypertension: Secondary | ICD-10-CM

## 2024-04-07 DIAGNOSIS — Z0000 Encounter for general adult medical examination without abnormal findings: Secondary | ICD-10-CM

## 2024-04-17 ENCOUNTER — Encounter: Payer: Self-pay | Admitting: Medical

## 2024-04-17 ENCOUNTER — Ambulatory Visit: Payer: No Typology Code available for payment source | Admitting: Medical

## 2024-04-17 VITALS — BP 128/74 | HR 62 | Ht 66.54 in | Wt 286.6 lb

## 2024-04-17 DIAGNOSIS — Z0000 Encounter for general adult medical examination without abnormal findings: Secondary | ICD-10-CM

## 2024-04-17 DIAGNOSIS — I10 Essential (primary) hypertension: Secondary | ICD-10-CM

## 2024-04-17 DIAGNOSIS — Z1389 Encounter for screening for other disorder: Secondary | ICD-10-CM

## 2024-04-17 DIAGNOSIS — I77819 Aortic ectasia, unspecified site: Secondary | ICD-10-CM

## 2024-04-17 DIAGNOSIS — Z13220 Encounter for screening for lipoid disorders: Secondary | ICD-10-CM

## 2024-04-17 DIAGNOSIS — Z131 Encounter for screening for diabetes mellitus: Secondary | ICD-10-CM

## 2024-04-17 DIAGNOSIS — Z23 Encounter for immunization: Secondary | ICD-10-CM | POA: Diagnosis not present

## 2024-04-17 DIAGNOSIS — Z7185 Encounter for immunization safety counseling: Secondary | ICD-10-CM

## 2024-04-17 DIAGNOSIS — Z125 Encounter for screening for malignant neoplasm of prostate: Secondary | ICD-10-CM

## 2024-04-17 LAB — LIPID PANEL

## 2024-04-17 LAB — URINALYSIS, ROUTINE W REFLEX MICROSCOPIC
Bilirubin, UA: NEGATIVE
Glucose, UA: NEGATIVE
Ketones, UA: NEGATIVE
Leukocytes,UA: NEGATIVE
Nitrite, UA: NEGATIVE
Protein,UA: NEGATIVE
RBC, UA: NEGATIVE
Specific Gravity, UA: 1.02 (ref 1.005–1.030)
Urobilinogen, Ur: 0.2 mg/dL (ref 0.2–1.0)
pH, UA: 6.5 (ref 5.0–7.5)

## 2024-04-17 NOTE — Progress Notes (Signed)
 Subjective:   HPI  Steven Mullins is a 48 y.o. male who presents for Chief Complaint  Patient presents with   Annual Exam    Annual physical fasting / talk about how the cpap was going. Needs refills on amlodipine . No medical history change since last visit     Patient Care Team: Yatzari Jonsson, Christiane Cowing, PA-C as PCP - General (Family Medicine) Dr. Alvis Jourdain, GI Eye doctor Dentist   Concerns: Compliant with BP medication  Since last visit he started CPAP.  Wife says he doesn't snore now, and he thinks its helping.  No more palpitations.   Reviewed their medical, surgical, family, social, medication, and allergy history and updated chart as appropriate.  No Known Allergies  Past Medical History:  Diagnosis Date   Allergic rhinitis    Colon polyp 04/2016   Erectile dysfunction    Family history of colon cancer in mother    Family history of prostate cancer    Hypertension 2019   Obesity    Prostatitis 03/2016    Current Outpatient Medications on File Prior to Visit  Medication Sig Dispense Refill   amLODipine  (NORVASC ) 10 MG tablet TAKE 1 TABLET BY MOUTH DAILY 90 tablet 3   No current facility-administered medications on file prior to visit.     Current Outpatient Medications:    amLODipine  (NORVASC ) 10 MG tablet, TAKE 1 TABLET BY MOUTH DAILY, Disp: 90 tablet, Rfl: 3  Family History  Problem Relation Age of Onset   Cancer Mother 20       colon   Hypertension Mother    Thyroid  disease Mother    Hypertension Father    Cancer Father 20       died of prostate cancer at 45   Thyroid  disease Brother    Cancer Maternal Uncle        prostate   Heart disease Maternal Uncle        MI   Cancer Maternal Grandfather        prostate   Cancer Paternal Aunt    Diabetes Cousin    Diabetes Cousin    Stroke Neg Hx     Past Surgical History:  Procedure Laterality Date   APPENDECTOMY     age 41yo   COLONOSCOPY  04/2016   serrated polyp, repeat in 2022; Dr. Alvis Jourdain   COLONOSCOPY  05/2023   polyps, repeat 2029, Dr. Alvis Jourdain   WISDOM TOOTH EXTRACTION       Review of Systems  Constitutional:  Negative for chills, fever, malaise/fatigue and weight loss.  HENT:  Negative for congestion, ear pain, hearing loss, sore throat and tinnitus.   Eyes:  Negative for blurred vision, pain and redness.  Respiratory:  Negative for cough, hemoptysis and shortness of breath.   Cardiovascular:  Negative for chest pain, palpitations, orthopnea, claudication and leg swelling.  Gastrointestinal:  Negative for abdominal pain, blood in stool, constipation, diarrhea, nausea and vomiting.  Genitourinary:  Negative for dysuria, flank pain, frequency, hematuria and urgency.  Musculoskeletal:  Negative for falls, joint pain and myalgias.  Skin:  Negative for itching and rash.  Neurological:  Negative for dizziness, tingling, speech change, weakness and headaches.  Endo/Heme/Allergies:  Negative for polydipsia. Does not bruise/bleed easily.  Psychiatric/Behavioral:  Negative for depression and memory loss. The patient is not nervous/anxious and does not have insomnia.         Objective:  BP 128/74   Pulse 62   Ht 5' 6.54" (1.69  m)   Wt 286 lb 9.6 oz (130 kg)   BMI 45.52 kg/m   Wt Readings from Last 3 Encounters:  04/17/24 286 lb 9.6 oz (130 kg)  03/04/24 282 lb (127.9 kg)  02/05/24 284 lb 3.2 oz (128.9 kg)   BP Readings from Last 3 Encounters:  04/17/24 128/74  03/04/24 120/70  02/17/24 127/85    General appearance: alert, no distress, WD/WN, African American male Skin: darker brown skin over superior ear pinna bilat, no other worrisome lesions, unremarkable HEENT: normocephalic, conjunctiva/corneas normal, sclerae anicteric, PERRLA, EOMi, nares patent, no discharge or erythema, pharynx normal Oral cavity: MMM, tongue normal, teeth normal Neck: supple, no lymphadenopathy, no thyromegaly, no masses, normal ROM, no bruits Chest: non tender, normal shape  and expansion Heart: RRR, normal S1, S2, no murmurs Lungs: CTA bilaterally, no wheezes, rhonchi, or rales Abdomen: +bs, soft, non tender, non distended, no masses, no hepatomegaly, no splenomegaly, no bruits Back: non tender, normal ROM, no scoliosis Musculoskeletal: upper extremities non tender, no obvious deformity, normal ROM throughout, lower extremities non tender, no obvious deformity, normal ROM throughout Extremities: no edema, no cyanosis, no clubbing Pulses: 2+ symmetric, upper and lower extremities, normal cap refill Neurological: alert, oriented x 3, CN2-12 intact, strength normal upper extremities and lower extremities, sensation normal throughout, DTRs 2+ throughout, no cerebellar signs, gait normal Psychiatric: normal affect, behavior normal, pleasant  GU: normal male external genitalia,circumcised, nontender, no masses, no hernia, no lymphadenopathy Rectal: anus normal tone, prostate WNL    Assessment and Plan :   Encounter Diagnoses  Name Primary?   Encounter for health maintenance examination in adult Yes   Aortic dilatation (HCC)    Essential hypertension, benign    Screening for lipid disorders    Screening for prostate cancer    Vaccine counseling    Screening for hematuria or proteinuria    Screening for diabetes mellitus    Need for pneumococcal 20-valent conjugate vaccination      This visit was a preventative care visit, also known as wellness visit or routine physical.   Topics typically include healthy lifestyle, diet, exercise, preventative care, vaccinations, sick and well care, proper use of emergency dept and after hours care, as well as other concerns.     Separate significant issues discussed: Hypertension - continue your current medication Amlodipine  10mg    Aortic dilation noted on 2024 scan.  Updated repeat CT chest angio order placed  BMI >45, obesity - work on efforts to lose weight through health diet and exercise  Nervous tic -we  discussed his largest tic grabbing his ears.  We discussed strategies to quit doing since this is a nervous habit.  General Recommendations: Continue to return yearly for your annual wellness and preventative care visits.  This gives us  a chance to discuss healthy lifestyle, exercise, vaccinations, review your chart record, and perform screenings where appropriate.  I recommend you see your eye doctor yearly for routine vision care.  I recommend you see your dentist yearly for routine dental care including hygiene visits twice yearly.   Vaccination  Immunization History  Administered Date(s) Administered   Influenza,inj,Quad PF,6+ Mos 08/08/2018, 11/29/2020   Influenza-Unspecified 08/11/2017, 08/11/2017   PFIZER(Purple Top)SARS-COV-2 Vaccination 02/06/2020, 02/27/2020, 11/29/2020   PNEUMOCOCCAL CONJUGATE-20 04/17/2024   Tdap 04/10/2013, 04/15/2023    Vaccine recommendations: Shingrix and pneumococcal vaccines, yearly flu shot.  Counseled on the pneumococcal vaccine.  Vaccine information sheet given.  Pneumococcal vaccine Prevnar 20 given after consent obtained.   Screening for cancer:  Colon cancer screening: I reviewed your 05/2023 colonoscopy report, polyps removed, repeat 5 years, Dr. Alvis Jourdain.  Prostate Cancer screening: The recommended prostate cancer screening test is a blood test called the prostate-specific antigen (PSA) test. PSA is a protein that is made in the prostate. As you age, your prostate naturally produces more PSA. Abnormally high PSA levels may be caused by: Prostate cancer. An enlarged prostate that is not caused by cancer (benign prostatic hyperplasia, or BPH). This condition is very common in older men. A prostate gland infection (prostatitis) or urinary tract infection. Certain medicines such as male hormones (like testosterone) or other medicines that raise testosterone levels. A rectal exam may be done as part of prostate cancer screening to help  provide information about the size of your prostate gland. When a rectal exam is performed, it should be done after the PSA level is drawn to avoid any effect on the results.   Skin cancer screening: Check your skin regularly for new changes, growing lesions, or other lesions of concern Come in for evaluation if you have skin lesions of concern.   Lung cancer screening: If you have a greater than 20 pack year history of tobacco use, then you may qualify for lung cancer screening with a chest CT scan.   Please call your insurance company to inquire about coverage for this test.   Pancreatic cancer:  no current screening test is available or routinely recommended. (risk factors: smoking, overweight or obese, diabetes, chronic pancreatitis, work exposure - dry cleaning, metal working, 50yo>, M>F, Tree surgeon, family hx/o, hereditary breast, ovarian, melanoma, lynch, peutz-jeghers).  Symptoms: jaundice, dark urine, light color or greasy stools, itchy skin, belly or back pain, weight loss, poor appetite, nausea, vomiting, liver enlargement, DVT/blood clots.   We currently don't have screenings for other cancers besides breast, cervical, colon, and lung cancers.  If you have a strong family history of cancer or have other cancer screening concerns, please let me know.  Genetic testing referral is an option for individuals with high cancer risk in the family.  There are some other cancer screenings in development currently.   Bone health: Get at least 150 minutes of aerobic exercise weekly Get weight bearing exercise at least once weekly Bone density test:  A bone density test is an imaging test that uses a type of X-ray to measure the amount of calcium and other minerals in your bones. The test may be used to diagnose or screen you for a condition that causes weak or thin bones (osteoporosis), predict your risk for a broken bone (fracture), or determine how well your osteoporosis treatment is  working. The bone density test is recommended for females 65 and older, or females or males <65 if certain risk factors such as thyroid  disease, long term use of steroids such as for asthma or rheumatological issues, vitamin D deficiency, estrogen deficiency, family history of osteoporosis, self or family history of fragility fracture in first degree relative.    Heart health: Get at least 150 minutes of aerobic exercise weekly Limit alcohol It is important to maintain a healthy blood pressure and healthy cholesterol numbers  Heart disease screening: Screening for heart disease includes screening for blood pressure, fasting lipids, glucose/diabetes screening, BMI height to weight ratio, reviewed of smoking status, physical activity, and diet.    Goals include blood pressure 120/80 or less, maintaining a healthy lipid/cholesterol profile, preventing diabetes or keeping diabetes numbers under good control, not smoking or using tobacco products, exercising  most days per week or at least 150 minutes per week of exercise, and eating healthy variety of fruits and vegetables, healthy oils, and avoiding unhealthy food choices like fried food, fast food, high sugar and high cholesterol foods.    Other tests may possibly include EKG test, CT coronary calcium score, echocardiogram, exercise treadmill stress test.     CT coronary test 04/2023 showed mild dilatation of ascending aorta 4cm, 3mm benign appearing pulmonary nodule.  Zero calcium score.   Vascular disease screening: For higher risk individuals including smokers, diabetics, patients with known heart disease or high blood pressure, kidney disease, and others, screening for vascular disease or atherosclerosis of the arteries is available.  Examples may include carotid ultrasound, abdominal aortic ultrasound, ABI blood flow screening in the legs, thoracic aorta screening.    Medical care options: I recommend you continue to seek care here first  for routine care.  We try really hard to have available appointments Monday through Friday daytime hours for sick visits, acute visits, and physicals.  Urgent care should be used for after hours and weekends for significant issues that cannot wait till the next day.  The emergency department should be used for significant potentially life-threatening emergencies.  The emergency department is expensive, can often have long wait times for less significant concerns, so try to utilize primary care, urgent care, or telemedicine when possible to avoid unnecessary trips to the emergency department.  Virtual visits and telemedicine have been introduced since the pandemic started in 2020, and can be convenient ways to receive medical care.  We offer virtual appointments as well to assist you in a variety of options to seek medical care.   Legal  Take the time to do a last will and testament, Advanced Directives including Health Care Power of Attorney and Living Will documents.  Don't leave your family with burdens that can be handled ahead of time.   Advanced Directives: I recommend you consider completing a Health Care Power of Attorney and Living Will.   These documents respect your wishes and help alleviate burdens on your loved ones if you were to become terminally ill or be in a position to need those documents enforced.    You can complete Advanced Directives yourself, have them notarized, then have copies made for our office, for you and for anybody you feel should have them in safe keeping.  Or, you can have an attorney prepare these documents.   If you haven't updated your Last Will and Testament in a while, it may be worthwhile having an attorney prepare these documents together and save on some costs.       Spiritual and Emotional Health Keeping a healthy spiritual life can help you better manage your physical health. Your spiritual life can help you to cope with any issues that may arise with your  physical health.  Balance can keep us  healthy and help us  to recover.  If you are struggling with your spiritual health there are questions that you may want to ask yourself:  What makes me feel most complete? When do I feel most connected to the rest of the world? Where do I find the most inner strength? What am I doing when I feel whole?  Helpful tips: Being in nature. Some people feel very connected and at peace when they are walking outdoors or are outside. Helping others. Some feel the largest sense of wellbeing when they are of service to others. Being of service can take  on many forms. It can be doing volunteer work, being kind to strangers, or offering a hand to a friend in need. Gratitude. Some people find they feel the most connected when they remain grateful. They may make lists of all the things they are grateful for or say a thank you out loud for all they have.    Emotional Health Are you in tune with your emotional health?  Check out this link: http://www.marquez-love.com/    Financial Health Make sure you use a budget for your personal finances Make sure you are insured against risks (health insurance, life insurance, auto insurance, etc) Save more, spend less Set financial goals If you need help in this area, good resources include counseling through [employer] or other community resources, have a meeting with a Social research officer, government, and a good resource is the [employer] was seen today for annual exam.  Diagnoses and all orders for this visit:  Encounter for health maintenance examination in adult -     CT ANGIO CHEST AORTA W/CM & OR WO/CM; Future -     Lipid panel -     Hepatic function panel -     PSA -     Hemoglobin A1c -     Urinalysis, Routine w reflex microscopic  Aortic dilatation (HCC) -     CT ANGIO CHEST AORTA W/CM & OR WO/CM; Future  Essential hypertension, benign  Screening for lipid disorders -      Lipid panel  Screening for prostate cancer -     PSA  Vaccine counseling  Screening for hematuria or proteinuria  Screening for diabetes mellitus -     Hemoglobin A1c  Need for pneumococcal 20-valent conjugate vaccination -     Pneumococcal conjugate vaccine 20-valent (Prevnar 20)    Follow-up pending labs, yearly for physical

## 2024-04-18 LAB — HEPATIC FUNCTION PANEL
ALT: 22 IU/L (ref 0–44)
AST: 20 IU/L (ref 0–40)
Albumin: 4.2 g/dL (ref 4.1–5.1)
Alkaline Phosphatase: 104 IU/L (ref 44–121)
Bilirubin Total: 0.7 mg/dL (ref 0.0–1.2)
Bilirubin, Direct: 0.22 mg/dL (ref 0.00–0.40)
Total Protein: 7.3 g/dL (ref 6.0–8.5)

## 2024-04-18 LAB — HEMOGLOBIN A1C
Est. average glucose Bld gHb Est-mCnc: 114 mg/dL
Hgb A1c MFr Bld: 5.6 % (ref 4.8–5.6)

## 2024-04-18 LAB — LIPID PANEL
Chol/HDL Ratio: 3.9 ratio (ref 0.0–5.0)
Cholesterol, Total: 201 mg/dL — ABNORMAL HIGH (ref 100–199)
HDL: 51 mg/dL (ref >39)
LDL Chol Calc (NIH): 137 mg/dL — ABNORMAL HIGH (ref 0–99)
Triglycerides: 69 mg/dL (ref 0–149)
VLDL Cholesterol Cal: 13 mg/dL (ref 5–40)

## 2024-04-18 LAB — PSA: Prostate Specific Ag, Serum: 0.2 ng/mL (ref 0.0–4.0)

## 2024-04-19 ENCOUNTER — Other Ambulatory Visit: Payer: Self-pay | Admitting: Medical

## 2024-04-19 ENCOUNTER — Ambulatory Visit: Payer: Self-pay | Admitting: Medical

## 2024-04-19 NOTE — Progress Notes (Signed)
 Results sent through MyChart

## 2024-04-27 ENCOUNTER — Ambulatory Visit
Admission: RE | Admit: 2024-04-27 | Discharge: 2024-04-27 | Disposition: A | Discharge status: Home / Self Care | Source: Ambulatory Visit | Attending: Medical | Admitting: Medical

## 2024-04-27 DIAGNOSIS — Z0000 Encounter for general adult medical examination without abnormal findings: Secondary | ICD-10-CM

## 2024-04-27 DIAGNOSIS — I77819 Aortic ectasia, unspecified site: Secondary | ICD-10-CM

## 2024-04-27 MED ORDER — IOPAMIDOL (ISOVUE-370) INJECTION 76%
75.0000 mL | Freq: Once | INTRAVENOUS | Status: AC | PRN
  Administered 2024-04-27: 75 mL via INTRAVENOUS

## 2024-05-03 NOTE — Progress Notes (Signed)
 Results sent through MyChart

## 2024-11-19 ENCOUNTER — Other Ambulatory Visit: Payer: Self-pay | Admitting: Medical

## 2025-04-13 ENCOUNTER — Encounter: Payer: Self-pay | Admitting: Medical
